# Patient Record
Sex: Male | Born: 1955 | Race: White | Hispanic: No | Marital: Married | Smoking: Current every day smoker
Health system: Southern US, Community
[De-identification: ages and names within clinical notes are randomized; demographics above are authoritative.]

## PROBLEM LIST (undated history)

## (undated) DIAGNOSIS — E785 Hyperlipidemia, unspecified: Secondary | ICD-10-CM

## (undated) DIAGNOSIS — J449 Chronic obstructive pulmonary disease, unspecified: Secondary | ICD-10-CM

## (undated) DIAGNOSIS — J939 Pneumothorax, unspecified: Secondary | ICD-10-CM

## (undated) HISTORY — PX: CHEST TUBE INSERTION: SHX231

## (undated) HISTORY — DX: Chronic obstructive pulmonary disease, unspecified: J44.9

## (undated) HISTORY — DX: Hyperlipidemia, unspecified: E78.5

---

## 2004-06-17 ENCOUNTER — Inpatient Hospital Stay: Payer: Self-pay | Admitting: General Surgery

## 2006-06-22 ENCOUNTER — Emergency Department: Payer: Self-pay | Admitting: Emergency Medicine

## 2006-06-24 ENCOUNTER — Encounter: Payer: Self-pay | Admitting: Internal Medicine

## 2006-07-07 ENCOUNTER — Encounter: Payer: Self-pay | Admitting: Internal Medicine

## 2017-05-23 ENCOUNTER — Other Ambulatory Visit: Payer: Self-pay

## 2017-05-23 ENCOUNTER — Emergency Department: Payer: BLUE CROSS/BLUE SHIELD

## 2017-05-23 ENCOUNTER — Inpatient Hospital Stay
Admission: EM | Admit: 2017-05-23 | Discharge: 2017-05-26 | DRG: 190 | Disposition: A | Discharge status: Home / Self Care | Payer: BLUE CROSS/BLUE SHIELD | Attending: Internal Medicine | Admitting: Internal Medicine

## 2017-05-23 DIAGNOSIS — J40 Bronchitis, not specified as acute or chronic: Secondary | ICD-10-CM

## 2017-05-23 DIAGNOSIS — R0602 Shortness of breath: Secondary | ICD-10-CM

## 2017-05-23 DIAGNOSIS — J449 Chronic obstructive pulmonary disease, unspecified: Secondary | ICD-10-CM

## 2017-05-23 DIAGNOSIS — J9601 Acute respiratory failure with hypoxia: Secondary | ICD-10-CM | POA: Diagnosis present

## 2017-05-23 DIAGNOSIS — J441 Chronic obstructive pulmonary disease with (acute) exacerbation: Secondary | ICD-10-CM | POA: Diagnosis not present

## 2017-05-23 DIAGNOSIS — R0902 Hypoxemia: Secondary | ICD-10-CM

## 2017-05-23 DIAGNOSIS — F17210 Nicotine dependence, cigarettes, uncomplicated: Secondary | ICD-10-CM | POA: Diagnosis present

## 2017-05-23 DIAGNOSIS — J9691 Respiratory failure, unspecified with hypoxia: Secondary | ICD-10-CM | POA: Diagnosis present

## 2017-05-23 HISTORY — DX: Pneumothorax, unspecified: J93.9

## 2017-05-23 LAB — BASIC METABOLIC PANEL
Anion gap: 10 (ref 5–15)
BUN: 11 mg/dL (ref 6–20)
CHLORIDE: 96 mmol/L — AB (ref 101–111)
CO2: 29 mmol/L (ref 22–32)
Calcium: 9.1 mg/dL (ref 8.9–10.3)
Creatinine, Ser: 0.63 mg/dL (ref 0.61–1.24)
Glucose, Bld: 100 mg/dL — ABNORMAL HIGH (ref 65–99)
POTASSIUM: 4.4 mmol/L (ref 3.5–5.1)
SODIUM: 135 mmol/L (ref 135–145)

## 2017-05-23 LAB — CBC WITH DIFFERENTIAL/PLATELET
Basophils Absolute: 0 10*3/uL (ref 0–0.1)
Basophils Relative: 0 %
Eosinophils Absolute: 0 10*3/uL (ref 0–0.7)
Eosinophils Relative: 0 %
HCT: 39 % — ABNORMAL LOW (ref 40.0–52.0)
HEMOGLOBIN: 13.3 g/dL (ref 13.0–18.0)
LYMPHS ABS: 1.5 10*3/uL (ref 1.0–3.6)
LYMPHS PCT: 13 %
MCH: 32.7 pg (ref 26.0–34.0)
MCHC: 34.1 g/dL (ref 32.0–36.0)
MCV: 95.9 fL (ref 80.0–100.0)
Monocytes Absolute: 1.3 10*3/uL — ABNORMAL HIGH (ref 0.2–1.0)
Monocytes Relative: 11 %
NEUTROS ABS: 8.6 10*3/uL — AB (ref 1.4–6.5)
NEUTROS PCT: 76 %
PLATELETS: 315 10*3/uL (ref 150–440)
RBC: 4.07 MIL/uL — ABNORMAL LOW (ref 4.40–5.90)
RDW: 13.3 % (ref 11.5–14.5)
WBC: 11.3 10*3/uL — AB (ref 3.8–10.6)

## 2017-05-23 MED ORDER — METHYLPREDNISOLONE SODIUM SUCC 125 MG IJ SOLR
INTRAMUSCULAR | Status: AC
  Filled 2017-05-23: qty 2

## 2017-05-23 MED ORDER — IPRATROPIUM-ALBUTEROL 0.5-2.5 (3) MG/3ML IN SOLN
3.0000 mL | Freq: Once | RESPIRATORY_TRACT | Status: AC
  Administered 2017-05-23: 3 mL via RESPIRATORY_TRACT

## 2017-05-23 MED ORDER — IPRATROPIUM-ALBUTEROL 0.5-2.5 (3) MG/3ML IN SOLN
RESPIRATORY_TRACT | Status: AC
  Filled 2017-05-23: qty 9

## 2017-05-23 MED ORDER — METHYLPREDNISOLONE SODIUM SUCC 125 MG IJ SOLR
125.0000 mg | Freq: Once | INTRAMUSCULAR | Status: AC
  Administered 2017-05-23: 125 mg via INTRAVENOUS

## 2017-05-23 MED ORDER — SODIUM CHLORIDE 0.9 % IV SOLN
500.0000 mg | Freq: Once | INTRAVENOUS | Status: AC
  Administered 2017-05-24: 500 mg via INTRAVENOUS
  Filled 2017-05-23: qty 500

## 2017-05-23 MED ORDER — MAGNESIUM SULFATE 2 GM/50ML IV SOLN
2.0000 g | Freq: Once | INTRAVENOUS | Status: AC
  Administered 2017-05-23: 2 g via INTRAVENOUS
  Filled 2017-05-23: qty 50

## 2017-05-23 MED ORDER — IPRATROPIUM-ALBUTEROL 0.5-2.5 (3) MG/3ML IN SOLN
3.0000 mL | Freq: Once | RESPIRATORY_TRACT | Status: AC
  Administered 2017-05-23: 3 mL via RESPIRATORY_TRACT
  Filled 2017-05-23: qty 6

## 2017-05-23 NOTE — ED Notes (Signed)
Patient transported to X-ray 

## 2017-05-23 NOTE — ED Notes (Signed)
Pt assisted to wheelchair upon arrival; talking in short sentences; pt says he was seen this week and diagnosed with bronchitis; taking medications as prescribed but not feeling any better; noted cough; sats 88% on room air upon arrival

## 2017-05-23 NOTE — ED Notes (Signed)
Pt with improvement in wheezing.

## 2017-05-23 NOTE — ED Notes (Signed)
This tech in to triage room to obtain lab draw and noticed that pt was working a little harder then normal to breath with slight chest retraction (o2 sats at 89%), tech called to [employer] for verbal order of 2L of o2. Pt placed on 2L [place] and o2 saturation up to 96%, pt appears more comfortable at this time and first nurse RN notified.

## 2017-05-23 NOTE — ED Triage Notes (Signed)
Patient reports diagnosed last week with bronchitis.  Patient reports continued shortness of breath.  Patient with congested cough.  Hx of smoking.

## 2017-05-24 ENCOUNTER — Inpatient Hospital Stay: Payer: BLUE CROSS/BLUE SHIELD

## 2017-05-24 ENCOUNTER — Encounter: Payer: Self-pay | Admitting: Emergency Medicine

## 2017-05-24 DIAGNOSIS — J441 Chronic obstructive pulmonary disease with (acute) exacerbation: Secondary | ICD-10-CM | POA: Diagnosis present

## 2017-05-24 DIAGNOSIS — R0602 Shortness of breath: Secondary | ICD-10-CM | POA: Diagnosis present

## 2017-05-24 DIAGNOSIS — J9601 Acute respiratory failure with hypoxia: Secondary | ICD-10-CM | POA: Diagnosis present

## 2017-05-24 DIAGNOSIS — F17210 Nicotine dependence, cigarettes, uncomplicated: Secondary | ICD-10-CM | POA: Diagnosis present

## 2017-05-24 DIAGNOSIS — J9691 Respiratory failure, unspecified with hypoxia: Secondary | ICD-10-CM | POA: Diagnosis present

## 2017-05-24 LAB — BLOOD GAS, VENOUS
ACID-BASE EXCESS: 5.6 mmol/L — AB (ref 0.0–2.0)
Bicarbonate: 31.1 mmol/L — ABNORMAL HIGH (ref 20.0–28.0)
O2 Saturation: 65.1 %
PATIENT TEMPERATURE: 37
PCO2 VEN: 48 mmHg (ref 44.0–60.0)
PH VEN: 7.42 (ref 7.250–7.430)
pO2, Ven: 33 mmHg (ref 32.0–45.0)

## 2017-05-24 LAB — TSH: TSH: 0.709 u[IU]/mL (ref 0.350–4.500)

## 2017-05-24 LAB — TROPONIN I: Troponin I: <0.03 ng/mL (ref <0.03)

## 2017-05-24 MED ORDER — DOCUSATE SODIUM 100 MG PO CAPS
100.0000 mg | ORAL_CAPSULE | Freq: Two times a day (BID) | ORAL | Status: DC
  Administered 2017-05-24 – 2017-05-25 (×4): 100 mg via ORAL
  Filled 2017-05-24 (×5): qty 1

## 2017-05-24 MED ORDER — ENOXAPARIN SODIUM 40 MG/0.4ML ~~LOC~~ SOLN
40.0000 mg | SUBCUTANEOUS | Status: DC
  Administered 2017-05-24 – 2017-05-25 (×2): 40 mg via SUBCUTANEOUS
  Filled 2017-05-24 (×2): qty 0.4

## 2017-05-24 MED ORDER — PREDNISONE 10 MG PO TABS: 5.0000 mg | ORAL_TABLET | Freq: Every day | ORAL | Status: DC

## 2017-05-24 MED ORDER — ACETAMINOPHEN 650 MG RE SUPP: 650.0000 mg | Freq: Four times a day (QID) | RECTAL | Status: DC | PRN

## 2017-05-24 MED ORDER — NICOTINE 21 MG/24HR TD PT24
21.0000 mg | MEDICATED_PATCH | Freq: Every day | TRANSDERMAL | Status: DC
  Administered 2017-05-24 – 2017-05-26 (×3): 21 mg via TRANSDERMAL
  Filled 2017-05-24 (×3): qty 1

## 2017-05-24 MED ORDER — TIOTROPIUM BROMIDE MONOHYDRATE 18 MCG IN CAPS
18.0000 ug | ORAL_CAPSULE | Freq: Every day | RESPIRATORY_TRACT | Status: DC
  Administered 2017-05-24 – 2017-05-26 (×3): 18 ug via RESPIRATORY_TRACT
  Filled 2017-05-24: qty 5

## 2017-05-24 MED ORDER — PREDNISONE 20 MG PO TABS
40.0000 mg | ORAL_TABLET | Freq: Every day | ORAL | Status: AC
  Administered 2017-05-25: 40 mg via ORAL
  Filled 2017-05-24: qty 2

## 2017-05-24 MED ORDER — ONDANSETRON HCL 4 MG PO TABS: 4.0000 mg | ORAL_TABLET | Freq: Four times a day (QID) | ORAL | Status: DC | PRN

## 2017-05-24 MED ORDER — PREDNISONE 50 MG PO TABS
50.0000 mg | ORAL_TABLET | Freq: Every day | ORAL | Status: AC
Start: 2017-05-24 — End: 2017-05-24
  Administered 2017-05-24: 50 mg via ORAL
  Filled 2017-05-24: qty 1

## 2017-05-24 MED ORDER — ONDANSETRON HCL 4 MG/2ML IJ SOLN: 4.0000 mg | Freq: Four times a day (QID) | INTRAMUSCULAR | Status: DC | PRN

## 2017-05-24 MED ORDER — PREDNISONE 20 MG PO TABS: 20.0000 mg | ORAL_TABLET | Freq: Every day | ORAL | Status: DC

## 2017-05-24 MED ORDER — ACETAMINOPHEN 325 MG PO TABS
650.0000 mg | ORAL_TABLET | Freq: Four times a day (QID) | ORAL | Status: DC | PRN
  Administered 2017-05-24: 650 mg via ORAL
  Filled 2017-05-24: qty 2

## 2017-05-24 MED ORDER — PREDNISONE 20 MG PO TABS
30.0000 mg | ORAL_TABLET | Freq: Every day | ORAL | Status: AC
  Administered 2017-05-26: 30 mg via ORAL
  Filled 2017-05-24: qty 1

## 2017-05-24 MED ORDER — PREDNISONE 10 MG PO TABS: 10.0000 mg | ORAL_TABLET | Freq: Every day | ORAL | Status: DC

## 2017-05-24 MED ORDER — AZITHROMYCIN 250 MG PO TABS
250.0000 mg | ORAL_TABLET | Freq: Every day | ORAL | Status: DC
  Administered 2017-05-24 – 2017-05-26 (×3): 250 mg via ORAL
  Filled 2017-05-24 (×3): qty 1

## 2017-05-24 NOTE — Progress Notes (Signed)
Advanced care plan.  Purpose of the Encounter: CODE STATUS  Parties in Attendance:Patient  Patient's Decision Capacity:Good Subjective/Patient's story: Presented for shortness of breath and wheezing Objective/Medical story Has copd and hypoxia Needs nebs treatments and oxygen Goals of care determination:  Advance directives discussed with the patient For now patient wants everything done which includes cardiac resuscitation, intubation and ventilator if the need arises CODE STATUS: Full code Time spent discussing advanced care planning: 16 minutes

## 2017-05-24 NOTE — Progress Notes (Signed)
[facility] [place] at [address]   PATIENT NAME: Chase Burnett    MR#:  [id number]  DATE OF BIRTH:  1955-09-10  SUBJECTIVE:  CHIEF COMPLAINT:   Chief Complaint  Patient presents with  . Shortness of Breath  Patient seen and evaluated today On oxygen via nasal cannula Has wheezing and difficulty breathing Gets short of breath and short winded  REVIEW OF SYSTEMS:    ROS  CONSTITUTIONAL: No documented fever. No fatigue, weakness. No weight gain, no weight loss.  EYES: No blurry or double vision.  ENT: No tinnitus. No postnasal drip. No redness of the oropharynx.  RESPIRATORY: Has cough, Has wheeze, no hemoptysis. Has dyspnea.  CARDIOVASCULAR: No chest pain. No orthopnea. No palpitations. No syncope.  GASTROINTESTINAL: No nausea, no vomiting or diarrhea. No abdominal pain. No melena or hematochezia.  GENITOURINARY: No dysuria or hematuria.  ENDOCRINE: No polyuria or nocturia. No heat or cold intolerance.  HEMATOLOGY: No anemia. No bruising. No bleeding.  INTEGUMENTARY: No rashes. No lesions.  MUSCULOSKELETAL: No arthritis. No swelling. No gout.  NEUROLOGIC: No numbness, tingling, or ataxia. No seizure-type activity.  PSYCHIATRIC: No anxiety. No insomnia. No ADD.   DRUG ALLERGIES:  No Known Allergies  VITALS:  Blood pressure 136/77, pulse 94, temperature 97.8 F (36.6 C), temperature source Oral, resp. rate 20, height  (1.727 m), weight 63.6 kg (140 lb 4.8 oz), SpO2 98 %.  PHYSICAL EXAMINATION:   Physical Exam  GENERAL:  62 y.o.-year-old patient lying in the bed with no acute distress.  EYES: Pupils equal, round, reactive to light and accommodation. No scleral icterus. Extraocular muscles intact.  HEENT: Head atraumatic, normocephalic. Oropharynx and nasopharynx clear.  NECK:  Supple, no jugular venous distention. No thyroid enlargement, no tenderness.  LUNGS: Decreased breath sounds bilaterally, bilateral wheezing,. No use of accessory muscles  of respiration.  CARDIOVASCULAR: S1, S2 normal. No murmurs, rubs, or gallops.  ABDOMEN: Soft, nontender, nondistended. Bowel sounds present. No organomegaly or mass.  EXTREMITIES: No cyanosis, clubbing or edema b/l.    NEUROLOGIC: Cranial nerves II through XII are intact. No focal Motor or sensory deficits b/l.   PSYCHIATRIC: The patient is alert and oriented x 3.  SKIN: No obvious rash, lesion, or ulcer.   LABORATORY PANEL:   CBC Recent Labs  Lab 05/23/17 2156  WBC 11.3*  HGB 13.3  HCT 39.0*  PLT 315   ------------------------------------------------------------------------------------------------------------------ Chemistries  Recent Labs  Lab 05/23/17 2156  NA 135  K 4.4  CL 96*  CO2 29  GLUCOSE 100*  BUN 11  CREATININE 0.63  CALCIUM 9.1   ------------------------------------------------------------------------------------------------------------------  Cardiac Enzymes Recent Labs  Lab 05/23/17 2156  TROPONINI <0.03   ------------------------------------------------------------------------------------------------------------------  RADIOLOGY:  Dg Chest 2 View  Result Date: 05/23/2017 CLINICAL DATA:  Shortness of Breath EXAM: CHEST - 2 VIEW COMPARISON:  07/01/2004 FINDINGS: Coarsened interstitial opacities throughout the lungs, likely severe chronic interstitial lung disease. Hyperinflation/COPD. Nodular areas in both mid lungs may represent a component of the interstitial disease but I cannot exclude other pulmonary nodules. No effusions. Heart is normal size. IMPRESSION: Chronic interstitial lung disease, COPD. Nodular densities in both mid lungs. These could be further evaluated with noncontrast chest CT. Electronically Signed   By: Charlett Nose M.D.   On: 05/23/2017 22:26     ASSESSMENT AND PLAN:   62 year old male patient with history of tobacco abuse presented to the hospital with shortness of breath, wheezing and low oxygen saturation.  -Acute hypoxic  respiratory distress  secondary to COPD Continue oxygen via nasal cannula  -New onset COPD with exacerbation IV steroids Nebulization treatments Oral Zithromax antibiotics  -Tobacco abuse Tobacco cessation counseled to the patient for 6 minutes Nicotine patch offered  -DVT prophylaxis subcu Lovenox daily  All the records are reviewed and case discussed with Care Management/Social Worker. Management plans discussed with the patient, family and they are in agreement.  CODE STATUS: Full code  DVT Prophylaxis: SCDs  TOTAL TIME TAKING CARE OF THIS PATIENT: 35 minutes.   POSSIBLE D/C IN 2 to3  DAYS, DEPENDING ON CLINICAL CONDITION.  Ihor Austin M.D on 05/24/2017 at 10:28 AM  Between 7am to 6pm - Pager - [phone]  After 6pm go to [email or web address] - password [email or web address]  [facility]  Office  [phone]  CC: Primary care physician; Patient, No Pcp Per  Note: This dictation was prepared with Dragon dictation along with smaller phrase technology. Any transcriptional errors that result from this process are unintentional.

## 2017-05-24 NOTE — H&P (Signed)
Chase Burnett is an 62 y.o. male.   Chief Complaint: Shortness of breath HPI: The patient with no chronic medical problems presents emergency department with shortness of breath.  The patient states that he has had cough and worsening shortness of breath over the last few days.  In the emergency department oxygen saturations were 88% on arrival.  He denies shortness of breath, nausea, vomiting or diaphoresis.  The patient received Solu-Medrol and multiple breathing treatments.  Oxygen saturations improved with supplemental oxygen via nasal cannula.  Due to his increased work of breathing and hypoxia the emergency department staff called the hospitalist service for admission.  Past Medical History:  Diagnosis Date  . Pneumothorax     Past Surgical History:  Procedure Laterality Date  . CHEST TUBE INSERTION      History reviewed. No pertinent family history. none Social History:  reports that he has been smoking cigarettes.  He has a 40.00 pack-year smoking history. He has never used smokeless tobacco. He reports that he drinks alcohol. His drug history is not on file.  Allergies: No Known Allergies  No medications prior to admission.    Results for orders placed or performed during the hospital encounter of 05/23/17 (from the past 48 hour(s))  CBC with Differential     Status: Abnormal   Collection Time: 05/23/17  9:56 PM  Result Value Ref Range   WBC 11.3 (H) 3.8 - 10.6 K/uL   RBC 4.07 (L) 4.40 - 5.90 MIL/uL   Hemoglobin 13.3 13.0 - 18.0 g/dL   HCT 39.0 (L) 40.0 - 52.0 %   MCV 95.9 80.0 - 100.0 fL   MCH 32.7 26.0 - 34.0 pg   MCHC 34.1 32.0 - 36.0 g/dL   RDW 13.3 11.5 - 14.5 %   Platelets 315 150 - 440 K/uL   Neutrophils Relative % 76 %   Neutro Abs 8.6 (H) 1.4 - 6.5 K/uL   Lymphocytes Relative 13 %   Lymphs Abs 1.5 1.0 - 3.6 K/uL   Monocytes Relative 11 %   Monocytes Absolute 1.3 (H) 0.2 - 1.0 K/uL   Eosinophils Relative 0 %   Eosinophils Absolute 0.0 0 - 0.7 K/uL   Basophils Relative 0 %   Basophils Absolute 0.0 0 - 0.1 K/uL    Comment: Performed at [facility], [address] [place], [place] [address]  Basic metabolic panel     Status: Abnormal   Collection Time: 05/23/17  9:56 PM  Result Value Ref Range   Sodium 135 135 - 145 mmol/L   Potassium 4.4 3.5 - 5.1 mmol/L   Chloride 96 (L) 101 - 111 mmol/L   CO2 29 22 - 32 mmol/L   Glucose, Bld 100 (H) 65 - 99 mg/dL   BUN 11 6 - 20 mg/dL   Creatinine, Ser 0.63 0.61 - 1.24 mg/dL   Calcium 9.1 8.9 - 10.3 mg/dL   GFR calc non Af Amer >60 >60 mL/min   GFR calc Af Amer >60 >60 mL/min    Comment: (NOTE) The eGFR has been calculated using the CKD EPI equation. This calculation has not been validated in all clinical situations. eGFR's persistently <60 mL/min signify possible Chronic Kidney Disease.    Anion gap 10 5 - 15    Comment: Performed at [facility], [address] [place], [place] [address]  Troponin I     Status: None   Collection Time: 05/23/17  9:56 PM  Result Value Ref Range  Troponin I <0.03 <0.03 ng/mL    Comment: Performed at [facility], [address] [place], [place] [address]  TSH     Status: None   Collection Time: 05/23/17  9:56 PM  Result Value Ref Range   TSH 0.709 0.350 - 4.500 uIU/mL    Comment: Performed by a 3rd Generation assay with a functional sensitivity of <=0.01 uIU/mL. Performed at [facility], [address] [place], [place] [address]   Blood gas, venous     Status: Abnormal   Collection Time: 05/23/17 11:53 PM  Result Value Ref Range   pH, Ven 7.42 7.250 - 7.430   pCO2, Ven 48 44.0 - 60.0 mmHg   pO2, Ven 33.0 32.0 - 45.0 mmHg   Bicarbonate 31.1 (H) 20.0 - 28.0 mmol/L   Acid-Base Excess 5.6 (H) 0.0 - 2.0 mmol/L   O2 Saturation 65.1 %   Patient temperature 37.0    Collection site LINE    Sample type VENOUS     Comment: Performed at [facility], [address] [place],  [address]   Dg  Chest 2 View  Result Date: 05/23/2017 CLINICAL DATA:  Shortness of Breath EXAM: CHEST - 2 VIEW COMPARISON:  07/01/2004 FINDINGS: Coarsened interstitial opacities throughout the lungs, likely severe chronic interstitial lung disease. Hyperinflation/COPD. Nodular areas in both mid lungs may represent a component of the interstitial disease but I cannot exclude other pulmonary nodules. No effusions. Heart is normal size. IMPRESSION: Chronic interstitial lung disease, COPD. Nodular densities in both mid lungs. These could be further evaluated with noncontrast chest CT. Electronically Signed   By: Rolm Baptise M.D.   On: 05/23/2017 22:26    Review of Systems  Constitutional: Negative for chills and fever.  HENT: Negative for sore throat and tinnitus.   Eyes: Negative for blurred vision and redness.  Respiratory: Positive for cough and shortness of breath.   Cardiovascular: Negative for chest pain, palpitations, orthopnea and PND.  Gastrointestinal: Negative for abdominal pain, diarrhea, nausea and vomiting.  Genitourinary: Negative for dysuria, frequency and urgency.  Musculoskeletal: Negative for joint pain and myalgias.  Skin: Negative for rash.       No lesions  Neurological: Negative for speech change, focal weakness and weakness.  Endo/Heme/Allergies: Does not bruise/bleed easily.       No temperature intolerance  Psychiatric/Behavioral: Negative for depression and suicidal ideas.    Blood pressure (!) 133/57, pulse 93, temperature 98.2 F (36.8 C), temperature source Oral, resp. rate 19, height _0  (1.727 m), weight 63.6 kg (140 lb 4.8 oz), SpO2 95 %. Physical Exam  Vitals reviewed. Constitutional: He is oriented to person, place, and time. He appears well-developed and well-nourished. No distress. Nasal cannula in place.  HENT:  Head: Normocephalic and atraumatic.  Mouth/Throat: Oropharynx is clear and moist.  Eyes: Pupils are equal, round, and reactive to light. Conjunctivae and  EOM are normal. No scleral icterus.  Neck: Normal range of motion. Neck supple. No JVD present. No tracheal deviation present. No thyromegaly present.  Cardiovascular: Normal rate, regular rhythm and normal heart sounds. Exam reveals no gallop and no friction rub.  No murmur heard. Respiratory: Effort normal and breath sounds normal. No respiratory distress.  GI: Soft. Bowel sounds are normal. He exhibits no distension. There is no tenderness.  Genitourinary:  Genitourinary Comments: Deferred  Musculoskeletal: Normal range of motion. He exhibits no edema.  Lymphadenopathy:    He has no cervical adenopathy.  Neurological: He is alert and  oriented to person, place, and time. No cranial nerve deficit.  Skin: Skin is warm and dry. No rash noted. No erythema.  Psychiatric: He has a normal mood and affect. His behavior is normal. Judgment and thought content normal.     Assessment/Plan This is a 62 year old male admitted for respiratory failure with hypoxia. 1.  Respiratory failure: Acute; with hypoxia.  Appears to be COPD exacerbation although the patient does not carry this diagnosis.  He is a chronic smoker.  He has received azithromycin for anti-inflammatory effect which we will continue along with a steroid taper.  Albuterol as needed. 2.  COPD: New diagnosis; I have started Spiriva. 3.  Tobacco abuse: 40-year pack history.  NicoDerm patch while hospitalized.  Counseled on smoking cessation. 4.  DVT prophylaxis: Lovenox 5.  GI prophylaxis: None The patient is a full code.  Time spent on admission orders and patient care approximately 45 minutes  Harrie Foreman, MD 05/24/2017, 7:15 AM

## 2017-05-24 NOTE — ED Provider Notes (Signed)
[facility] Emergency Department Provider Note   ____________________________________________   First MD Initiated Contact with Patient 05/23/17 2336     (approximate)  I have reviewed the triage vital signs and the nursing notes.   HISTORY  Chief Complaint Shortness of Breath    HPI Chase Burnett is a 62 y.o. male who comes into the hospital today with some shortness of breath.  The patient states that he has bronchitis.  The [facility] and states that he was given antibiotics, prednisone and an inhaler.  He reports that the medications have not been helping and he is continued feeling short of breath.  He states that he has had these symptoms on and off for a while and he is never gotten rid of it.  The patient does not wear oxygen at home but he is a smoker.  The patient states that he felt warm on Thursday so assumed he had a fever.  He had some chills headache and sore throat.  His cough is occasionally productive of yellow appearing sputum but he states that when he coughs he feels like he cannot breathe.  The patient is here today for evaluation of his symptoms.  History reviewed. No pertinent past medical history.  Patient Active Problem List   Diagnosis Date Noted  . Respiratory failure with hypoxia (HCC) 05/24/2017    History reviewed. No pertinent surgical history.  Prior to Admission medications   Not on File    Allergies Patient has no known allergies.  No family history on file.  Social History Social History   Tobacco Use  . Smoking status: Current Every Day Smoker  . Smokeless tobacco: Never Used  Substance Use Topics  . Alcohol use: Yes  . Drug use: Not on file    Review of Systems  Constitutional:  fever/chills Eyes: No visual changes. ENT:  sore throat. Cardiovascular: Denies chest pain. Respiratory: Cough and shortness of breath. Gastrointestinal: No abdominal pain.  No  nausea, no vomiting.  Genitourinary: Negative for dysuria. Musculoskeletal: Negative for back pain. Skin: Negative for rash. Neurological: Headache   ____________________________________________   PHYSICAL EXAM:  VITAL SIGNS: ED Triage Vitals  Enc Vitals Group     BP 05/23/17 2153 (!) 159/82     Pulse Rate 05/23/17 2153 (!) 125     Resp 05/23/17 2153 (!) 26     Temp 05/23/17 2153 98.8 F (37.1 C)     Temp Source 05/23/17 2153 Oral     SpO2 05/23/17 2153 93 %     Weight 05/23/17 2150 142 lb (64.4 kg)     Height 05/23/17 2150  (1.727 m)     Head Circumference --      Peak Flow --      Pain Score 05/23/17 2150 0     Pain Loc --      Pain Edu? --      Excl. in GC? --     Constitutional: Alert and oriented. Well appearing and in moderate distress. Eyes: Conjunctivae are normal. PERRL. EOMI. Head: Atraumatic. Nose: No congestion/rhinnorhea. Mouth/Throat: Mucous membranes are moist.  Oropharynx non-erythematous. Cardiovascular: Tachycardia, regular rhythm. Grossly normal heart sounds.  Good peripheral circulation. Respiratory: Increased respiratory effort.  No retractions.  Expiratory wheezes in all lung fields Gastrointestinal: Soft and nontender. No distention.  Positive bowel sounds Musculoskeletal: No lower extremity tenderness nor edema.   Neurologic:  Normal speech and language.  Skin:  Skin is warm, dry and intact.  Psychiatric: Mood and affect are normal.   ____________________________________________   LABS (all labs ordered are listed, but only abnormal results are displayed)  Labs Reviewed  CBC WITH DIFFERENTIAL/PLATELET - Abnormal; Notable for the following components:      Result Value   WBC 11.3 (*)    RBC 4.07 (*)    HCT 39.0 (*)    Neutro Abs 8.6 (*)    Monocytes Absolute 1.3 (*)    All other components within normal limits  BASIC METABOLIC PANEL - Abnormal; Notable for the following components:   Chloride 96 (*)    Glucose, Bld 100 (*)     All other components within normal limits  BLOOD GAS, VENOUS - Abnormal; Notable for the following components:   Bicarbonate 31.1 (*)    Acid-Base Excess 5.6 (*)    All other components within normal limits  TROPONIN I   ____________________________________________  EKG  ED ECG REPORT I, Rebecka Apley, the attending physician, personally viewed and interpreted this ECG.   Date: 05/23/2017  EKG Time: 2153  Rate: 125  Rhythm: sinus tachycardia  Axis: normal  Intervals:none  ST&T Change: none  ____________________________________________  RADIOLOGY  ED MD interpretation: Chest x-ray: Chronic interstitial lung disease, COPD, nodular densities in both mid lungs.  Official radiology report(s): Dg Chest 2 View  Result Date: 05/23/2017 CLINICAL DATA:  Shortness of Breath EXAM: CHEST - 2 VIEW COMPARISON:  07/01/2004 FINDINGS: Coarsened interstitial opacities throughout the lungs, likely severe chronic interstitial lung disease. Hyperinflation/COPD. Nodular areas in both mid lungs may represent a component of the interstitial disease but I cannot exclude other pulmonary nodules. No effusions. Heart is normal size. IMPRESSION: Chronic interstitial lung disease, COPD. Nodular densities in both mid lungs. These could be further evaluated with noncontrast chest CT. Electronically Signed   By: Charlett Nose M.D.   On: 05/23/2017 22:26    ____________________________________________   PROCEDURES  Procedure(s) performed: None  Procedures  Critical Care performed: No  ____________________________________________   INITIAL IMPRESSION / ASSESSMENT AND PLAN / ED COURSE  As part of my medical decision making, I reviewed the following data within the electronic MEDICAL RECORD NUMBER Notes from prior ED visits and  Controlled Substance Database   This is a 62 year old male who comes into the hospital today with some shortness of breath.  The patient is a smoker but does not go to the  doctor so he does not have a formal diagnosis of COPD.  My differential diagnosis includes pneumonia, bronchitis, COPD  We did check some blood work to include a CBC, BMP, blood gas and a troponin.  The patient's CBC shows a white count of 11.3 but is otherwise unremarkable.  The patient's chest x-ray shows some chronic interstitial lung disease with no pneumonia.  The patient did receive 3 DuoNeb's and Solu-Medrol.  I then gave the patient some magnesium sulfate and a liter of normal saline.  Since he was hypoxic when he initially arrived 88% he will be admitted to the hospitalist service for further treatment and evaluation.      ____________________________________________   FINAL CLINICAL IMPRESSION(S) / ED DIAGNOSES  Final diagnoses:  Hypoxia  Bronchitis  Shortness of breath  Chronic obstructive pulmonary disease, unspecified COPD type [facility])     ED Discharge Orders    None       Note:  This document was prepared using Dragon voice recognition software and may include unintentional dictation errors.  Rebecka Apley, MD 05/24/17 [phone]

## 2017-05-24 NOTE — ED Notes (Signed)
Bed adjusted for comfort. Pt with resolution of wheezing. Pt is able to speak in full sentences. Pt states feels "a lot better".

## 2017-05-25 MED ORDER — IPRATROPIUM-ALBUTEROL 0.5-2.5 (3) MG/3ML IN SOLN
3.0000 mL | RESPIRATORY_TRACT | Status: DC | PRN
  Administered 2017-05-25 – 2017-05-26 (×2): 3 mL via RESPIRATORY_TRACT
  Filled 2017-05-25: qty 3

## 2017-05-25 NOTE — Progress Notes (Signed)
SATURATION QUALIFICATIONS: (This note is used to comply with regulatory documentation for home oxygen)  Patient Saturations on Room Air at Rest = 95%  Patient Saturations on Room Air while Ambulating = 84%  Patient Saturations on 3 Liters of oxygen while Ambulating = 91%  Please briefly explain why patient needs home oxygen: dyspnea on exertion

## 2017-05-25 NOTE — Progress Notes (Signed)
[facility] [place] NAME: Chase Burnett    MR#:  [id number]  DATE OF BIRTH:  [birth year]  SUBJECTIVE:  CHIEF COMPLAINT:   Chief Complaint  Patient presents with  . Shortness of Breath  Patient seen and evaluated today Comfortable while sitting on the bed Oxygen saturation drops on ambulation Dropped to 84% and patient had to be put on oxygen via nasal cannula at 3 L Still has wheezing Gets short of breath and short winded CT chest reviewed shows reticulo-nodular pattern with atypical infection, COPD changes  REVIEW OF SYSTEMS:    ROS  CONSTITUTIONAL: No documented fever. No fatigue, weakness. No weight gain, no weight loss.  EYES: No blurry or double vision.  ENT: No tinnitus. No postnasal drip. No redness of the oropharynx.  RESPIRATORY: Has occasional cough, Has wheezing, no hemoptysis. Has dyspnea.  CARDIOVASCULAR: No chest pain. No orthopnea. No palpitations. No syncope.  GASTROINTESTINAL: No nausea, no vomiting or diarrhea. No abdominal pain. No melena or hematochezia.  GENITOURINARY: No dysuria or hematuria.  ENDOCRINE: No polyuria or nocturia. No heat or cold intolerance.  HEMATOLOGY: No anemia. No bruising. No bleeding.  INTEGUMENTARY: No rashes. No lesions.  MUSCULOSKELETAL: No arthritis. No swelling. No gout.  NEUROLOGIC: No numbness, tingling, or ataxia. No seizure-type activity.  PSYCHIATRIC: No anxiety. No insomnia. No ADD.   DRUG ALLERGIES:  No Known Allergies  VITALS:  Blood pressure 137/80, pulse (!) 49, temperature 98 F (36.7 C), temperature source Oral, resp. rate (!) 24, height 5' 8"  (1.727 m), weight 65.5 kg (144 lb 4.8 oz), SpO2 99 %.  PHYSICAL EXAMINATION:   Physical Exam  GENERAL:  62 y.o.-year-old patient lying in the bed with no acute distress.  EYES: Pupils equal, round, reactive to light and accommodation. No scleral icterus. Extraocular muscles intact.  HEENT: Head atraumatic, normocephalic.  Oropharynx and nasopharynx clear.  NECK:  Supple, no jugular venous distention. No thyroid enlargement, no tenderness.  LUNGS: Decreased breath sounds bilaterally, bilateral wheezing,. No use of accessory muscles of respiration.  CARDIOVASCULAR: S1, S2 normal. No murmurs, rubs, or gallops.  ABDOMEN: Soft, nontender, nondistended. Bowel sounds present. No organomegaly or mass.  EXTREMITIES: No cyanosis, clubbing or edema b/l.    NEUROLOGIC: Cranial nerves II through XII are intact. No focal Motor or sensory deficits b/l.   PSYCHIATRIC: The patient is alert and oriented x 3.  SKIN: No obvious rash, lesion, or ulcer.   LABORATORY PANEL:   CBC Recent Labs  Lab 05/23/17 2156  WBC 11.3*  HGB 13.3  HCT 39.0*  PLT 315   ------------------------------------------------------------------------------------------------------------------ Chemistries  Recent Labs  Lab 05/23/17 2156  NA 135  K 4.4  CL 96*  CO2 29  GLUCOSE 100*  BUN 11  CREATININE 0.63  CALCIUM 9.1   ------------------------------------------------------------------------------------------------------------------  Cardiac Enzymes Recent Labs  Lab 05/23/17 2156  TROPONINI <0.03   ------------------------------------------------------------------------------------------------------------------  RADIOLOGY:  Dg Chest 2 View  Result Date: 05/23/2017 CLINICAL DATA:  Shortness of Breath EXAM: CHEST - 2 VIEW COMPARISON:  07/01/2004 FINDINGS: Coarsened interstitial opacities throughout the lungs, likely severe chronic interstitial lung disease. Hyperinflation/COPD. Nodular areas in both mid lungs may represent a component of the interstitial disease but I cannot exclude other pulmonary nodules. No effusions. Heart is normal size. IMPRESSION: Chronic interstitial lung disease, COPD. Nodular densities in both mid lungs. These could be further evaluated with noncontrast chest CT. Electronically Signed   By: Rolm Baptise M.D.    On: 05/23/2017 22:26  Ct Chest Wo Contrast  Result Date: 05/24/2017 CLINICAL DATA:  Productive cough on antibiotics four days. EXAM: CT CHEST WITHOUT CONTRAST TECHNIQUE: Multidetector CT imaging of the chest was performed following the standard protocol without IV contrast. COMPARISON:  Chest x-ray 05/23/2017 FINDINGS: Cardiovascular: Heart is normal size. Calcified plaque over the left main and 3 vessel coronary arteries. Minimal calcified plaque over the thoracic aorta. Mediastinum/Nodes: No significant hilar or mediastinal adenopathy. Remaining mediastinal structures are unremarkable. Lungs/Pleura: Lungs are adequately inflated demonstrate moderate centrilobular emphysema. There is a patchy bilateral reticulonodular pattern of opacification. No evidence of effusion. Airways are within normal. Upper Abdomen: No acute abnormality. Mild calcified plaque over the abdominal aorta. Nonspecific 1 cm right retrocrural lymph node. Musculoskeletal: Mild degenerate changes spine with several Schmorl's nodes. IMPRESSION: Bilateral patchy reticulonodular pattern of opacification. Findings are likely due to an infectious or atypical infectious process such as mycobacterium avium complex or an inflammatory process. Recommend follow-up CT 4-6 weeks. Atherosclerotic coronary artery disease. Aortic Atherosclerosis (ICD10-I70.0). Electronically Signed   By: Marin Olp M.D.   On: 05/24/2017 14:03     ASSESSMENT AND PLAN:   62 year old male patient with history of tobacco abuse presented to the hospital with shortness of breath, wheezing and low oxygen saturation.  -Acute hypoxic respiratory distress secondary to COPD improving slowly Continue oxygen via nasal cannula Wean oxygen as tolerated  -New onset COPD with exacerbation and oxygen requiring Oral steroids Nebulization treatments Oral Zithromax antibiotics for atypical infection : Bronchitis  -Tobacco abuse Tobacco cessation counseled to the patient for  6 minutes Nicotine patch offered  -DVT prophylaxis subcu Lovenox daily  All the records are reviewed and case discussed with Care Management/Social Worker. Management plans discussed with the patient, family and they are in agreement.  CODE STATUS: Full code  DVT Prophylaxis: SCDs  TOTAL TIME TAKING CARE OF THIS PATIENT: 33 minutes.   POSSIBLE D/C IN 1  DAY, DEPENDING ON CLINICAL CONDITION.  Saundra Shelling M.D on 05/25/2017 at 12:27 PM  Between 7am to 6pm - Pager - [phone]  After 6pm go to [email or web address] - password [email or web address] [facility]  Office  [phone]  CC: Primary care physician; Patient, No Pcp Per  Note: This dictation was prepared with Dragon dictation along with smaller phrase technology. Any transcriptional errors that result from this process are unintentional.

## 2017-05-25 NOTE — Progress Notes (Signed)
MD made aware of qualifying sats and absence of PRN resp meds. Ordered nebs q2h for Sob and wheezing. Called respiratory for consult

## 2017-05-26 MED ORDER — PREDNISONE 10 MG PO TABS: 10.0000 mg | ORAL_TABLET | Freq: Every day | ORAL | 0 refills | Status: DC

## 2017-05-26 MED ORDER — FLUTICASONE-SALMETEROL 250-50 MCG/DOSE IN AEPB: 1.0000 | INHALATION_SPRAY | Freq: Two times a day (BID) | RESPIRATORY_TRACT | 0 refills | Status: DC

## 2017-05-26 MED ORDER — NICOTINE 21 MG/24HR TD PT24: 21.0000 mg | MEDICATED_PATCH | Freq: Every day | TRANSDERMAL | 0 refills | Status: AC

## 2017-05-26 MED ORDER — AZITHROMYCIN 250 MG PO TABS: 250.0000 mg | ORAL_TABLET | Freq: Every day | ORAL | 0 refills | Status: AC

## 2017-05-26 MED ORDER — ALBUTEROL SULFATE HFA 108 (90 BASE) MCG/ACT IN AERS: 2.0000 | INHALATION_SPRAY | Freq: Four times a day (QID) | RESPIRATORY_TRACT | 2 refills | Status: DC | PRN

## 2017-05-26 NOTE — Discharge Summary (Signed)
[facility] [place] [facility]   PATIENT NAME: Chase Burnett    MR#:  [id number]  DATE OF BIRTH:  [birth year]  DATE OF ADMISSION:  05/23/2017 ADMITTING PHYSICIAN: Arnaldo Natal, MD  DATE OF DISCHARGE: 05/26/2017  PRIMARY CARE PHYSICIAN: Patient, No Pcp Per   ADMISSION DIAGNOSIS:  Shortness of breath [R06.02] Bronchitis [J40] Hypoxia [R09.02] Chronic obstructive pulmonary disease, unspecified COPD type (HCC) [J44.9]  DISCHARGE DIAGNOSIS:  Acute respiratory distress with hypoxia Bronchitis New onset COPD Tobacco abuse  SECONDARY DIAGNOSIS:   Past Medical History:  Diagnosis Date  . Pneumothorax      ADMITTING HISTORY The patient with no chronic medical problems presents emergency department with shortness of breath.  The patient states that he has had cough and worsening shortness of breath over the last few days.  In the emergency department oxygen saturations were 88% on arrival.  He denies shortness of breath, nausea, vomiting or diaphoresis.  The patient received Solu-Medrol and multiple breathing treatments.  Oxygen saturations improved with supplemental oxygen via nasal cannula.  Due to his increased work of breathing and hypoxia the emergency department staff called the hospitalist service for admission.  HOSPITAL COURSE:  Patient patient admitted to medical floor.  He received IV Solu-Medrol and aggressive nebulization treatments.  Patient was put on oxygen via nasal cannula for hypoxia.  His shortness of breath improved.  He was weaned off the oxygen.  He was switched to oral steroids.  Tobacco cessation counseled to the patient.  Nicotine patch was also offered.  Patient received Zithromax antibiotic during the stay in the hospital.  Patient will be discharged home on tapering dose of steroids and inhaler treatments.  Advised tobacco cessation strongly. CONSULTS OBTAINED:    DRUG ALLERGIES:  No Known Allergies  DISCHARGE MEDICATIONS:    Allergies as of 05/26/2017   No Known Allergies     Medication List    TAKE these medications   albuterol 108 (90 Base) MCG/ACT inhaler Commonly known as:  PROVENTIL HFA;VENTOLIN HFA Inhale 2 puffs into the lungs every 6 (six) hours as needed for wheezing or shortness of breath.   azithromycin 250 MG tablet Commonly known as:  ZITHROMAX Take 1 tablet (250 mg total) by mouth daily for 2 days.   Fluticasone-Salmeterol 250-50 MCG/DOSE Aepb Commonly known as:  ADVAIR DISKUS Inhale 1 puff into the lungs 2 (two) times daily.   nicotine 21 mg/24hr patch Commonly known as:  NICODERM CQ - dosed in mg/24 hours Place 1 patch (21 mg total) onto the skin daily for 28 days. Start taking on:  05/27/2017   predniSONE 10 MG tablet Commonly known as:  DELTASONE Take 1 tablet (10 mg total) by mouth daily. Label  & dispense according to the schedule below.  6 tablets day one, then 5 table day 2, then 4 tablets day 3, then 3 tablets day 4, 2 tablets day 5, then 1 tablet day 6, then stop       Today  Patient seen and evaluated today Decreased shortness of breath No chest pain Weaned off oxygen  VITAL SIGNS:  Blood pressure (!) 145/91, pulse 91, temperature 97.9 F (36.6 C), temperature source Oral, resp. rate 18, height  (1.727 m), weight 64.4 kg (141 lb 15.6 oz), SpO2 98 %.  I/O:    Intake/Output Summary (Last 24 hours) at 05/26/2017 1633 Last data filed at 05/26/2017 1300 Gross per 24 hour  Intake 480 ml  Output -  Net 480 ml  PHYSICAL EXAMINATION:  Physical Exam  GENERAL:  62 y.o.-year-old patient lying in the bed with no acute distress.  LUNGS: Normal breath sounds bilaterally, no wheezing, rales,rhonchi or crepitation. No use of accessory muscles of respiration.  CARDIOVASCULAR: S1, S2 normal. No murmurs, rubs, or gallops.  ABDOMEN: Soft, non-tender, non-distended. Bowel sounds present. No organomegaly or mass.  NEUROLOGIC: Moves all 4 extremities. PSYCHIATRIC: The  patient is alert and oriented x 3.  SKIN: No obvious rash, lesion, or ulcer.   DATA REVIEW:   CBC Recent Labs  Lab 05/23/17 2156  WBC 11.3*  HGB 13.3  HCT 39.0*  PLT 315    Chemistries  Recent Labs  Lab 05/23/17 2156  NA 135  K 4.4  CL 96*  CO2 29  GLUCOSE 100*  BUN 11  CREATININE 0.63  CALCIUM 9.1    Cardiac Enzymes Recent Labs  Lab 05/23/17 2156  TROPONINI <0.03    Microbiology Results  No results found for this or any previous visit.  RADIOLOGY:  No results found.  Follow up with PCP in 1 week.  Management plans discussed with the patient, family and they are in agreement.  CODE STATUS: Full code    Code Status Orders  (From admission, onward)        Start     Ordered   05/24/17 0303  Full code  Continuous     05/24/17 0302    Code Status History    This patient has a current code status but no historical code status.      TOTAL TIME TAKING CARE OF THIS PATIENT ON DAY OF DISCHARGE: more than 35 minutes.   Ihor Austin M.D on 05/26/2017 at 4:33 PM  Between 7am to 6pm - Pager - [phone]  After 6pm go to [email or web address] - password [email or web address]  [facility]  Office  [phone]  CC: Primary care physician; Patient, No Pcp Per  Note: This dictation was prepared with Dragon dictation along with smaller phrase technology. Any transcriptional errors that result from this process are unintentional.

## 2017-05-26 NOTE — Care Management (Signed)
Patient does not qualify for home O2.

## 2017-05-26 NOTE — Progress Notes (Signed)
SATURATION QUALIFICATIONS: (This note is used to comply with regulatory documentation for home oxygen)  Patient Saturations on Room Air at Rest = 92%  Patient Saturations on Room Air while Ambulating = 89%  Patient Saturations on 0 Liters of oxygen while Ambulating = 89-91%  Please briefly explain why patient needs home oxygen:

## 2017-06-01 ENCOUNTER — Telehealth: Payer: Self-pay

## 2017-06-01 NOTE — Telephone Encounter (Signed)
EMMI Follow-up: Noted on the report that the patient had questions who to follow-up with if there was a change in his condition.  He had been concerned on Friday that he didn't have a PCP to follow-up with right away. Talked with Chase Burnett and he said he was feeling much better today and had stopped smoking after being hospitalized for bronchitis and fluid in his lungs.  Finished his antibiotics yesterday. Not sure if he will follow-up with [facility] but maybe look for a PCP.  I provided him the phone number for [facility] Physician Referral Service [phone]. No needs noted today.

## 2017-08-03 ENCOUNTER — Other Ambulatory Visit: Payer: Self-pay | Admitting: Internal Medicine

## 2017-08-03 DIAGNOSIS — R9389 Abnormal findings on diagnostic imaging of other specified body structures: Secondary | ICD-10-CM

## 2017-08-17 ENCOUNTER — Ambulatory Visit: Payer: BLUE CROSS/BLUE SHIELD

## 2018-09-03 IMAGING — CT CT CHEST W/O CM
2 of 3 series · 15 of 36 positions shown, 18 images · non-contrast
Comparison: Chest x-ray 05/23/2017

CLINICAL DATA: Productive cough on antibiotics four days.

EXAM:
CT CHEST WITHOUT CONTRAST
TECHNIQUE: Multidetector CT imaging of the chest was performed following the
standard protocol without IV contrast.

[Series 2: thorax · axial · 0.77mm/px · z∈[-300,-24]mm · 12 of 162 slices shown, 15 images]
[im 12/162  mediastinal]
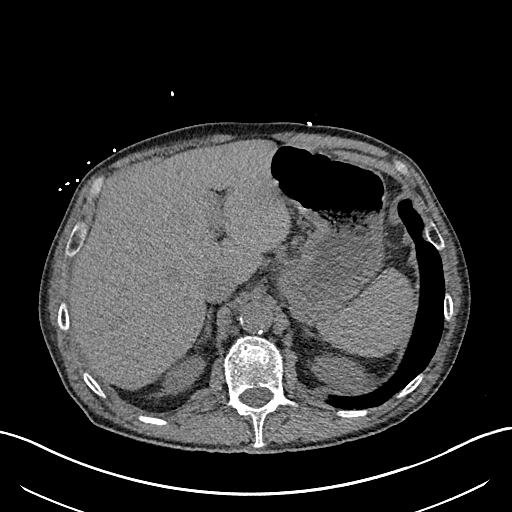
[im 12/162  lung]
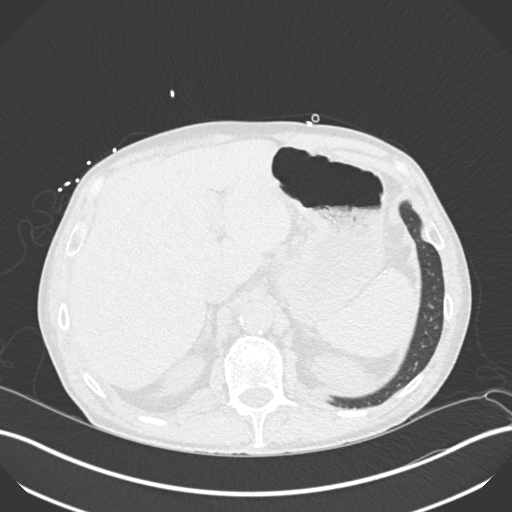
[im 24/162  lung]
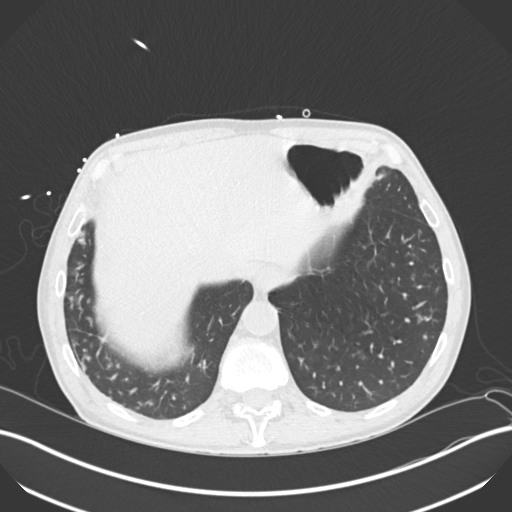
[im 36/162  lung]
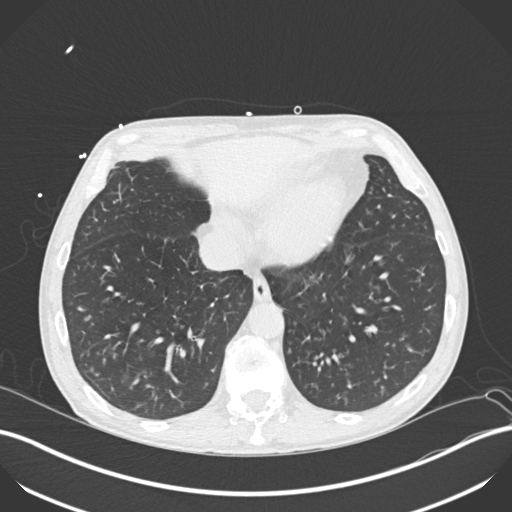
[im 48/162  lung]
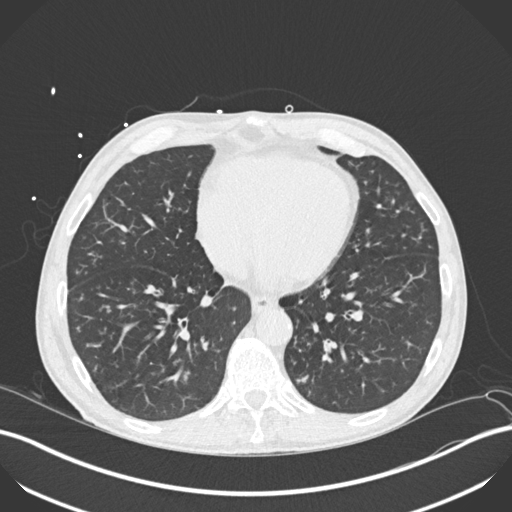
[im 60/162  mediastinal]
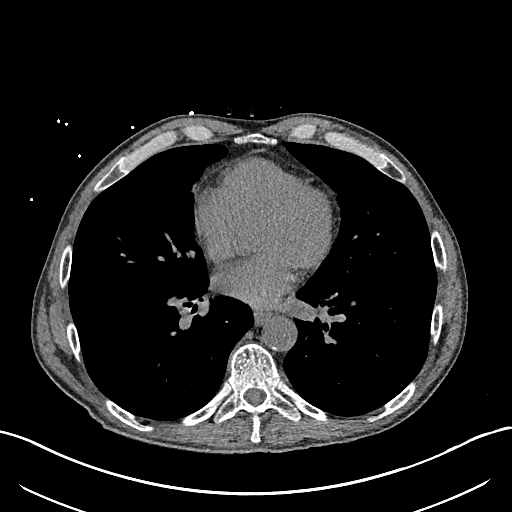
[im 60/162  lung]
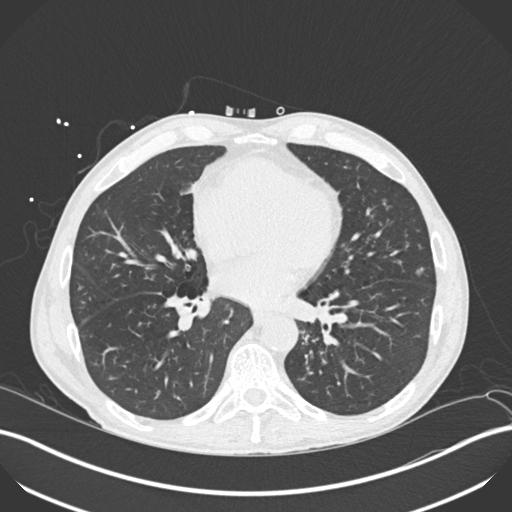
[im 72/162  lung]
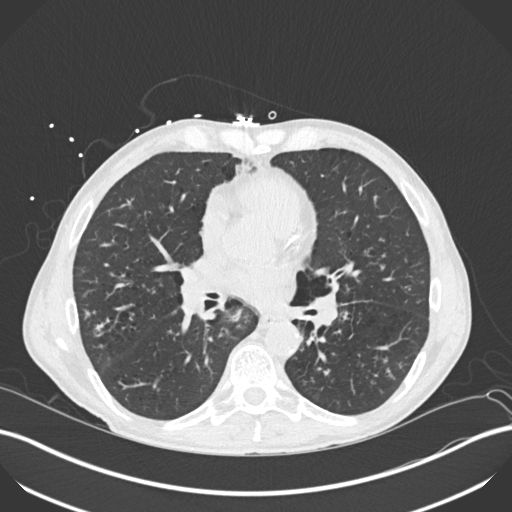
[im 90/162  lung]
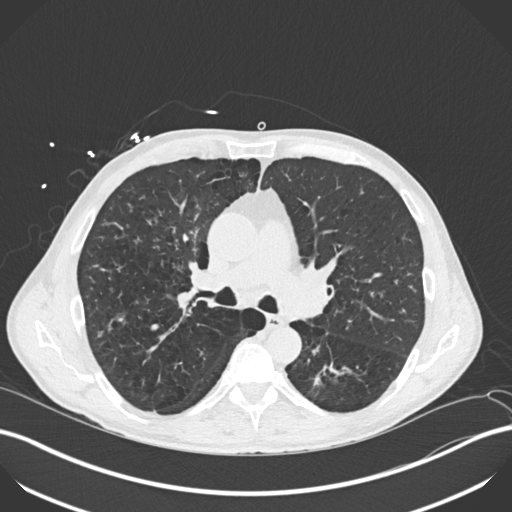
[im 102/162  lung]
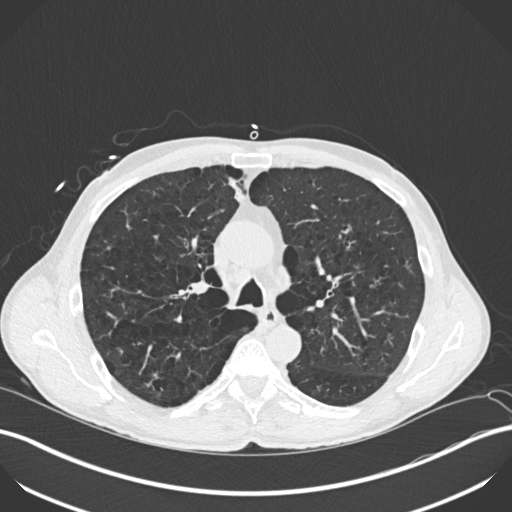
[im 114/162  mediastinal]
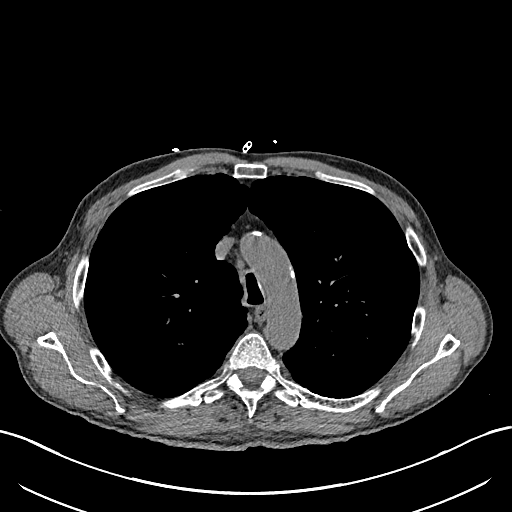
[im 114/162  lung]
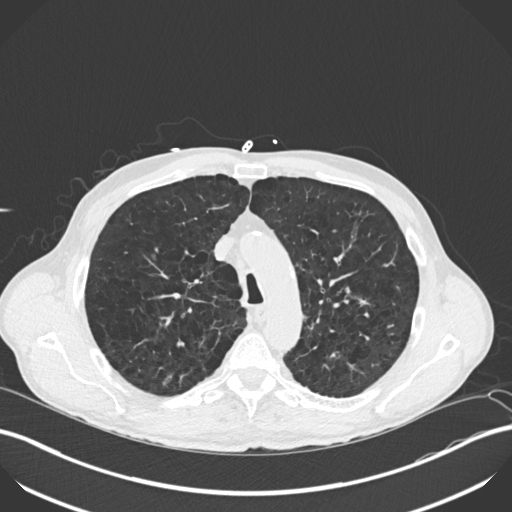
[im 126/162  lung]
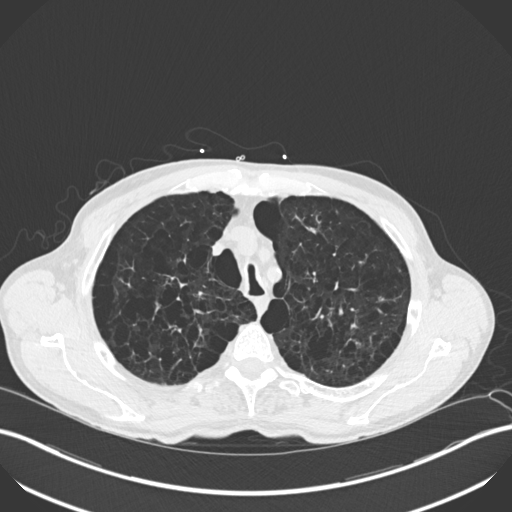
[im 138/162  lung]
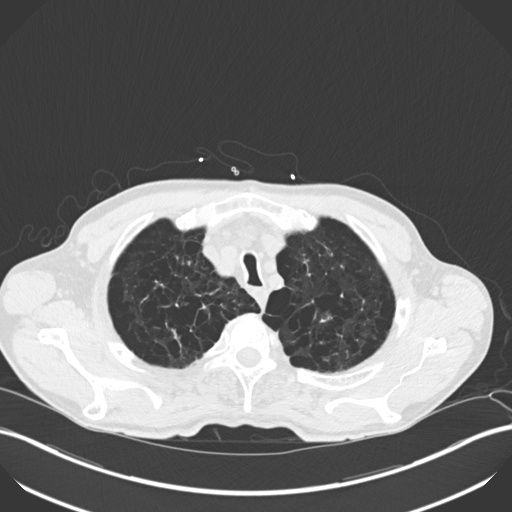
[im 150/162  lung]
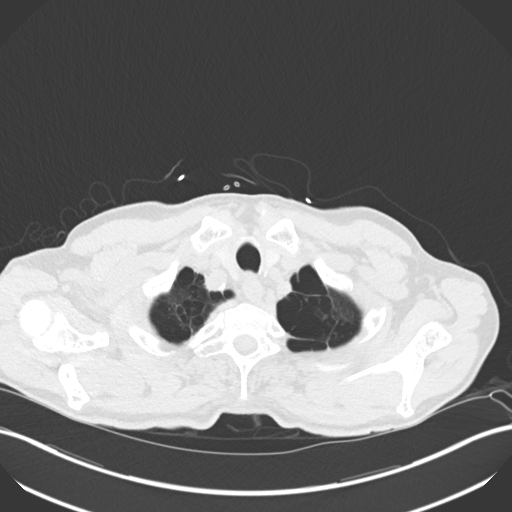

[Series 5: coronal · coronal · 0.63mm/px · 3 of 151 slices shown]
[im 31/151  lung]
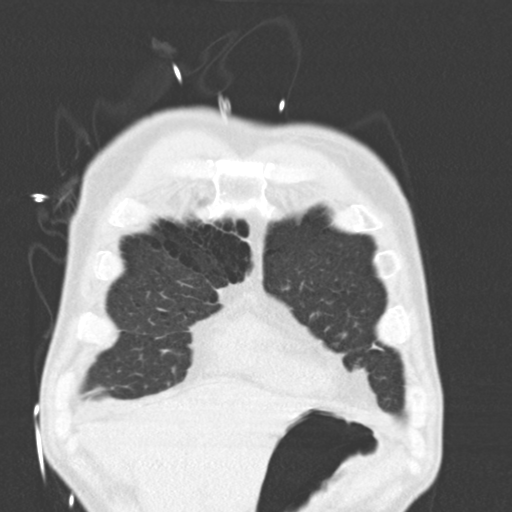
[im 61/151  lung]
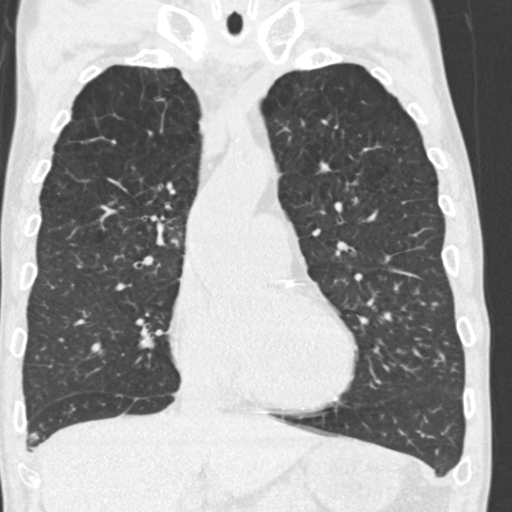
[im 91/151  lung]
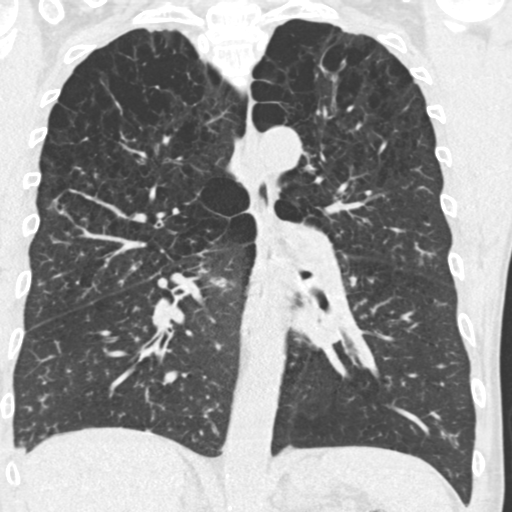

[15 of 36 positions shown; findings below may reference images not displayed]

FINDINGS: Cardiovascular: Heart is normal size. Calcified plaque over the left
main and 3 vessel coronary arteries. Minimal calcified plaque over
the thoracic aorta.

Mediastinum/Nodes: No significant hilar or mediastinal adenopathy.
Remaining mediastinal structures are unremarkable.

Lungs/Pleura: Lungs are adequately inflated demonstrate moderate
centrilobular emphysema. There is a patchy bilateral reticulonodular
pattern of opacification. No evidence of effusion. Airways are
within normal.

Upper Abdomen: No acute abnormality. Mild calcified plaque over the
abdominal aorta. Nonspecific 1 cm right retrocrural lymph node.

Musculoskeletal: Mild degenerate changes spine with several
Schmorl's nodes.
IMPRESSION: Bilateral patchy reticulonodular pattern of opacification. Findings
are likely due to an infectious or atypical infectious process such
as mycobacterium avium complex or an inflammatory process. Recommend
follow-up CT 4-6 weeks.

Atherosclerotic coronary artery disease.

Aortic Atherosclerosis ([id number]).

## 2018-10-29 ENCOUNTER — Encounter: Payer: Self-pay | Admitting: *Deleted

## 2019-01-13 ENCOUNTER — Telehealth: Payer: Self-pay

## 2019-01-13 NOTE — Telephone Encounter (Signed)
-----   Message from Avie Arenas, [place] sent at 11/18/2018  4:03 PM EST ----- Regarding: Colonoscopy Feb 2021 Feb 3rd requested date for colonoscopy at [facility].

## 2019-01-13 NOTE — Telephone Encounter (Signed)
Opened in error.    Thanks [employer]

## 2019-01-27 ENCOUNTER — Telehealth: Payer: Self-pay

## 2019-01-27 NOTE — Telephone Encounter (Signed)
-----   Message from Andrue Dini S Chanese Hartsough, CMA sent at 11/18/2018  4:03 PM EST ----- Regarding: Colonoscopy Feb 2021 Feb 3rd requested date for colonoscopy at [facility].  

## 2019-01-27 NOTE — Telephone Encounter (Signed)
LVM for pt to call office to discuss scheduling colonoscopy in February.  Thanks [place], [place]

## 2019-09-06 ENCOUNTER — Other Ambulatory Visit: Payer: Self-pay | Admitting: *Deleted

## 2019-09-06 MED ORDER — FLUTICASONE-SALMETEROL 250-50 MCG/DOSE IN AEPB: 1.0000 | INHALATION_SPRAY | Freq: Two times a day (BID) | RESPIRATORY_TRACT | 6 refills | Status: DC

## 2019-09-06 MED ORDER — ALBUTEROL SULFATE HFA 108 (90 BASE) MCG/ACT IN AERS: 2.0000 | INHALATION_SPRAY | Freq: Four times a day (QID) | RESPIRATORY_TRACT | 6 refills | Status: DC | PRN

## 2019-11-28 ENCOUNTER — Other Ambulatory Visit: Payer: Self-pay | Admitting: *Deleted

## 2019-11-28 MED ORDER — ALBUTEROL SULFATE HFA 108 (90 BASE) MCG/ACT IN AERS: 2.0000 | INHALATION_SPRAY | Freq: Four times a day (QID) | RESPIRATORY_TRACT | 3 refills | Status: DC | PRN

## 2019-11-28 MED ORDER — FLUTICASONE-SALMETEROL 250-50 MCG/DOSE IN AEPB: 1.0000 | INHALATION_SPRAY | Freq: Two times a day (BID) | RESPIRATORY_TRACT | 3 refills | Status: DC

## 2019-12-27 ENCOUNTER — Other Ambulatory Visit: Payer: Self-pay | Admitting: *Deleted

## 2019-12-27 MED ORDER — AZITHROMYCIN 250 MG PO TABS: ORAL_TABLET | ORAL | 0 refills | Status: DC

## 2020-01-09 ENCOUNTER — Encounter: Payer: Self-pay | Admitting: Nurse Practitioner

## 2020-01-09 ENCOUNTER — Ambulatory Visit (INDEPENDENT_AMBULATORY_CARE_PROVIDER_SITE_OTHER): Payer: BLUE CROSS/BLUE SHIELD | Admitting: Nurse Practitioner

## 2020-01-09 VITALS — BP 148/72 | HR 100 | Temp 97.6°F | Resp 16 | Ht 68.0 in | Wt 150.0 lb

## 2020-01-09 DIAGNOSIS — R0609 Other forms of dyspnea: Secondary | ICD-10-CM

## 2020-01-09 DIAGNOSIS — R0600 Dyspnea, unspecified: Secondary | ICD-10-CM

## 2020-01-09 DIAGNOSIS — F17210 Nicotine dependence, cigarettes, uncomplicated: Secondary | ICD-10-CM

## 2020-01-09 DIAGNOSIS — Z1211 Encounter for screening for malignant neoplasm of colon: Secondary | ICD-10-CM

## 2020-01-09 DIAGNOSIS — Z122 Encounter for screening for malignant neoplasm of respiratory organs: Secondary | ICD-10-CM

## 2020-01-09 DIAGNOSIS — F411 Generalized anxiety disorder: Secondary | ICD-10-CM

## 2020-01-09 DIAGNOSIS — J449 Chronic obstructive pulmonary disease, unspecified: Secondary | ICD-10-CM

## 2020-01-09 DIAGNOSIS — I2510 Atherosclerotic heart disease of native coronary artery without angina pectoris: Secondary | ICD-10-CM

## 2020-01-09 DIAGNOSIS — R5383 Other fatigue: Secondary | ICD-10-CM

## 2020-01-09 DIAGNOSIS — I700 Atherosclerosis of aorta: Secondary | ICD-10-CM

## 2020-01-09 MED ORDER — BREZTRI AEROSPHERE 160-9-4.8 MCG/ACT IN AERO: 2.0000 | INHALATION_SPRAY | Freq: Two times a day (BID) | RESPIRATORY_TRACT | 0 refills | Status: DC

## 2020-01-09 MED ORDER — TRAZODONE HCL 50 MG PO TABS: 25.0000 mg | ORAL_TABLET | Freq: Every evening | ORAL | 1 refills | Status: DC | PRN

## 2020-01-09 NOTE — Progress Notes (Signed)
[facility] [place], [place] [address]  Internal MEDICINE  Office Visit Note  Patient Name: Chase Burnett  [id number]  [id number]  Date of Service: 01/11/2020   Complaints/HPI Pt is here for establishment of PCP. Chief Complaint  Patient presents with  . New Patient (Initial Visit)    Over the last 6 months breathing has gotten worse, discuss meds, anxiety, difficulty sleeping sometimes, may have had bronchitis   . COPD  . Quality Metric Gaps    Colonoscopy, flu shot   The patient is here  To establish primary care provider.  -states that he was diagnosed with COPD In 2019. Uses advair twice daily. Was not taking ir at first but then didn't take it for long time as he felt fine. Over last six months, he states that his breathing has become worse. Gets short of breath with exertion. Last chest CT was done in 2019 when he was hospitalized for pneumonia.. did show coronary artery disease and aortic atherosclerosis. States that his PCP did stress test after that with no abnormal results. States that he has not followed up with PCP since then.  -patient is a smoker. -two weeks ago, felt like he had bronchitis. Was treated with z-pack. States that this did get better, however, hoarseness remains.  -has not had blood work done or other evaluation of COPD since initial diagnosis.  -needs to have routine, fasting labs -should have colon cancer screen    Current Medication: Outpatient Encounter Medications as of 01/09/2020  Medication Sig  . albuterol (VENTOLIN HFA) 108 (90 Base) MCG/ACT inhaler Inhale 2 puffs into the lungs every 6 (six) hours as needed for wheezing or shortness of breath.  . Budeson-Glycopyrrol-Formoterol (BREZTRI AEROSPHERE) 160-9-4.8 MCG/ACT AERO Inhale 2 puffs into the lungs 2 (two) times daily.  . traZODone (DESYREL) 50 MG tablet Take 0.5-1 tablets (25-50 mg total) by mouth at bedtime as needed for sleep.  . [DISCONTINUED]  Fluticasone-Salmeterol (ADVAIR DISKUS) 250-50 MCG/DOSE AEPB Inhale 1 puff into the lungs 2 (two) times daily.  . [DISCONTINUED] azithromycin (ZITHROMAX) 250 MG tablet As directed (Patient not taking: Reported on 01/09/2020)  . [DISCONTINUED] predniSONE (DELTASONE) 10 MG tablet Take 1 tablet (10 mg total) by mouth daily. Label  & dispense according to the schedule below.  6 tablets day one, then 5 table day 2, then 4 tablets day 3, then 3 tablets day 4, 2 tablets day 5, then 1 tablet day 6, then stop (Patient not taking: Reported on 01/09/2020)   No facility-administered encounter medications on file as of 01/09/2020.    Surgical History: Past Surgical History:  Procedure Laterality Date  . CHEST TUBE INSERTION      Medical History: Past Medical History:  Diagnosis Date  . COPD (chronic obstructive pulmonary disease) ([place])   . Pneumothorax     Family History: Family History  Problem Relation Age of Onset  . Cancer Mother   . Anxiety disorder Brother   . COPD Brother     Social History   Socioeconomic History  . Marital status: Married    Spouse name: Not on file  . Number of children: Not on file  . Years of education: Not on file  . Highest education level: Not on file  Occupational History  . Not on file  Tobacco Use  . Smoking status: Current Every Day Smoker    Packs/day: 1.00    Years: 40.00    Pack years: 40.00    Types: Cigarettes  .  Smokeless tobacco: Never Used  Substance and Sexual Activity  . Alcohol use: Not Currently  . Drug use: Never  . Sexual activity: Not on file  Other Topics Concern  . Not on file  Social History Narrative  . Not on file   Social Determinants of Health   Financial Resource Strain: Not on file  Food Insecurity: Not on file  Transportation Needs: Not on file  Physical Activity: Not on file  Stress: Not on file  Social Connections: Not on file  Intimate Partner Violence: Not on file     Review of Systems  Constitutional:  Positive for activity change and fatigue. Negative for chills and unexpected weight change.       States that his activity levels are decreasing due to worsening shortness of breath and fatigue  HENT: Negative for congestion, postnasal drip, rhinorrhea, sneezing and sore throat.   Respiratory: Positive for cough, chest tightness, shortness of breath and wheezing.   Cardiovascular: Negative for chest pain and palpitations.  Gastrointestinal: Negative for abdominal pain, constipation, diarrhea, nausea and vomiting.  Endocrine: Negative for cold intolerance, heat intolerance, polydipsia and polyuria.  Genitourinary: Negative for dysuria and frequency.  Musculoskeletal: Positive for arthralgias. Negative for back pain, joint swelling and neck pain.  Skin: Negative for rash.  Allergic/Immunologic: Positive for environmental allergies.  Neurological: Negative for dizziness, tremors, numbness and headaches.  Hematological: Negative for adenopathy. Does not bruise/bleed easily.  Psychiatric/Behavioral: Positive for sleep disturbance. Negative for behavioral problems (Depression) and suicidal ideas. The patient is nervous/anxious.        The patient has anxiety, especially when trying to go to sleep. It is difficult for him to breathe when lying down and sleeps, mostly, sitting up. Afraid to go all the way to sleep due to breathing.    Today's Vitals   01/09/20 1421  BP: (!) 148/72  Pulse: 100  Resp: 16  Temp: 97.6 F (36.4 C)  SpO2: 97%  Weight: 150 lb (68 kg)  Height: 5\' 8"  (1.727 m)   Body mass index is 22.81 kg/m.  Physical Exam Vitals and nursing note reviewed.  Constitutional:      General: He is not in acute distress.    Appearance: Normal appearance. He is well-developed and well-nourished. He is not diaphoretic.  HENT:     Head: Normocephalic and atraumatic.     Mouth/Throat:     Mouth: Oropharynx is clear and moist.     Pharynx: No oropharyngeal exudate.  Eyes:      Extraocular Movements: EOM normal.     Pupils: Pupils are equal, round, and reactive to light.  Neck:     Thyroid: No thyromegaly.     Vascular: Carotid bruit present. No JVD.     Trachea: No tracheal deviation.     Comments: Sot bruit auscultated, bilaterally.  Cardiovascular:     Rate and Rhythm: Normal rate and regular rhythm.     Heart sounds: Normal heart sounds. No murmur heard. No friction rub. No gallop.   Pulmonary:     Effort: Pulmonary effort is normal. No respiratory distress.     Breath sounds: Wheezing present. No rales.     Comments: Diminished breath sounds throughout the lung fields.  Chest:     Chest wall: No tenderness.  Abdominal:     General: Bowel sounds are normal.     Palpations: Abdomen is soft.     Tenderness: There is no abdominal tenderness.  Musculoskeletal:  General: Normal range of motion.     Cervical back: Normal range of motion and neck supple.  Lymphadenopathy:     Cervical: No cervical adenopathy.  Skin:    General: Skin is warm and dry.  Neurological:     Mental Status: He is alert and oriented to person, place, and time.     Cranial Nerves: No cranial nerve deficit.  Psychiatric:        Attention and Perception: Attention and perception normal.        Mood and Affect: Affect normal. Mood is anxious.        Speech: Speech normal.        Behavior: Behavior normal. Behavior is cooperative.        Thought Content: Thought content normal.        Cognition and Memory: Cognition and memory normal.        Judgment: Judgment normal.   Assessment/Plan: 1. Chronic obstructive pulmonary disease, unspecified COPD type (HCC) Patient using rescue inhaler many times during the day. Trial of Breztri - use two inhalations twice daily. Continue to use rescue inhaler as needed and as prescribed. Will get pulmonary function test and will refer to pulmonology for further evaluation.  - Pulmonary Function Test; Future - Ambulatory referral to  Pulmonology - Budeson-Glycopyrrol-Formoterol (BREZTRI AEROSPHERE) 160-9-4.8 MCG/ACT AERO; Inhale 2 puffs into the lungs 2 (two) times daily.  Dispense: 11 g; Refill: 0  2. Dyspnea on exertion Last chest CT was done in 2019 when he was hospitalized for pneumonia.. did show coronary artery disease and aortic atherosclerosis. Patient also with COPD. Trial of Breztri - use two inhalations twice daily. Continue to use rescue inhaler as needed and as prescribed. Will get pulmonary function test and will refer to pulmonology for further evaluation.  Will get echocardiogram and a CT chest for lung cancer screening.  - Pulmonary Function Test; Future - ECHOCARDIOGRAM COMPLETE; Future - Ambulatory referral to Pulmonology - CT CHEST LUNG CA SCREEN LOW DOSE W/O CM; Future - Budeson-Glycopyrrol-Formoterol (BREZTRI AEROSPHERE) 160-9-4.8 MCG/ACT AERO; Inhale 2 puffs into the lungs 2 (two) times daily.  Dispense: 11 g; Refill: 0  3. Screening for lung cancer - CT CHEST LUNG CA SCREEN LOW DOSE W/O CM; Future  4. Moderate cigarette smoker Smoking cessation discussed and encouraged. Patient states that he is currently trying to cut down and eventually quit altogether.  - CT CHEST LUNG CA SCREEN LOW DOSE W/O CM; Future - US Carotid Bilateral; Future  5. Coronary artery disease involving native coronary artery of native heart without angina pectoris CT chest done in 2019 showing coronary artery disease and aortic atherosclerosis. Will get echocardiogram and carotid doppler for further evaluation.  - ECHOCARDIOGRAM COMPLETE; Future - US Carotid Bilateral; Future  6. Aortic atherosclerosis (HCC) CT chest done in 2019 showing coronary artery disease and aortic atherosclerosis. Will get echocardiogram and carotid doppler for further evaluation.  - US Carotid Bilateral; Future  7. Generalized anxiety disorder Trial trazodone 50mg . Advised him to start with 1/2 tablet at bedtime. May increase to 1 tablet as needed  for anxiety/insomnia.  - traZODone (DESYREL) 50 MG tablet; Take 0.5-1 tablets (25-50 mg total) by mouth at bedtime as needed for sleep.  Dispense: 30 tablet; Refill: 1  8. Other fatigue Routine labs ordered, including full anemia panel  9. Screening for colon cancer Refer to GI for screening colonoscopy.  - Ambulatory referral to Gastroenterology  General Counseling: nashua homewood understanding of the findings of todays visit and  agrees with plan of treatment. I have discussed any further diagnostic evaluation that may be needed or ordered today. We also reviewed his medications today. he has been encouraged to call the office with any questions or concerns that should arise related to todays visit.    Counseling:  Smoking cessation counseling: 1. Pt acknowledges the risks of long term smoking, she will try to quite smoking. 2. Options for different medications including nicotine products, chewing gum, patch etc, Wellbutrin and Chantix is discussed 3. Goal and date of compete cessation is discussed 4. Total time spent in smoking cessation is 15 min.  This patient was seen by Vincent Gros FNP Collaboration with Dr Lyndon Code as a part of collaborative care agreement  Orders Placed This Encounter  Procedures  . CT CHEST LUNG CA SCREEN LOW DOSE W/O CM  . US Carotid Bilateral  . Ambulatory referral to Pulmonology  . Ambulatory referral to Gastroenterology  . ECHOCARDIOGRAM COMPLETE  . Pulmonary Function Test    Meds ordered this encounter  Medications  . traZODone (DESYREL) 50 MG tablet    Sig: Take 0.5-1 tablets (25-50 mg total) by mouth at bedtime as needed for sleep.    Dispense:  30 tablet    Refill:  1    Order Specific Question:   Supervising Provider    Answer:   Lyndon Code [[id number]]  . Budeson-Glycopyrrol-Formoterol (BREZTRI AEROSPHERE) 160-9-4.8 MCG/ACT AERO    Sig: Inhale 2 puffs into the lungs 2 (two) times daily.    Dispense:  11 g    Refill:  0     Samples provided today    Order Specific Question:   Supervising Provider    Answer:   Lyndon Code [[id number]]    Time spent: 45 Minutes

## 2020-01-10 ENCOUNTER — Other Ambulatory Visit: Payer: Self-pay | Admitting: Nurse Practitioner

## 2020-01-10 LAB — BASIC METABOLIC PANEL: Glucose: 110

## 2020-01-10 LAB — PSA: PSA: 0.4

## 2020-01-10 LAB — HM HEPATITIS C SCREENING LAB: HM Hepatitis Screen: NEGATIVE

## 2020-01-11 DIAGNOSIS — F17210 Nicotine dependence, cigarettes, uncomplicated: Secondary | ICD-10-CM | POA: Insufficient documentation

## 2020-01-11 DIAGNOSIS — R0600 Dyspnea, unspecified: Secondary | ICD-10-CM | POA: Insufficient documentation

## 2020-01-11 DIAGNOSIS — Z1211 Encounter for screening for malignant neoplasm of colon: Secondary | ICD-10-CM | POA: Insufficient documentation

## 2020-01-11 DIAGNOSIS — R0609 Other forms of dyspnea: Secondary | ICD-10-CM | POA: Insufficient documentation

## 2020-01-11 DIAGNOSIS — R5383 Other fatigue: Secondary | ICD-10-CM | POA: Insufficient documentation

## 2020-01-11 DIAGNOSIS — I2510 Atherosclerotic heart disease of native coronary artery without angina pectoris: Secondary | ICD-10-CM | POA: Insufficient documentation

## 2020-01-11 DIAGNOSIS — F411 Generalized anxiety disorder: Secondary | ICD-10-CM | POA: Insufficient documentation

## 2020-01-11 DIAGNOSIS — J449 Chronic obstructive pulmonary disease, unspecified: Secondary | ICD-10-CM | POA: Insufficient documentation

## 2020-01-11 DIAGNOSIS — I700 Atherosclerosis of aorta: Secondary | ICD-10-CM | POA: Insufficient documentation

## 2020-01-11 DIAGNOSIS — Z122 Encounter for screening for malignant neoplasm of respiratory organs: Secondary | ICD-10-CM | POA: Insufficient documentation

## 2020-01-11 LAB — COMPREHENSIVE METABOLIC PANEL
ALT: 14 IU/L (ref 0–44)
AST: 16 IU/L (ref 0–40)
Albumin/Globulin Ratio: 1.7 (ref 1.2–2.2)
Albumin: 4.7 g/dL (ref 3.8–4.8)
Alkaline Phosphatase: 108 IU/L (ref 44–121)
BUN/Creatinine Ratio: 15 (ref 10–24)
BUN: 12 mg/dL (ref 8–27)
Bilirubin Total: 0.5 mg/dL (ref 0.0–1.2)
CO2: 24 mmol/L (ref 20–29)
Calcium: 9.8 mg/dL (ref 8.6–10.2)
Chloride: 100 mmol/L (ref 96–106)
Creatinine, Ser: 0.78 mg/dL (ref 0.76–1.27)
GFR calc Af Amer: 111 mL/min/{1.73_m2} (ref >59)
GFR calc non Af Amer: 96 mL/min/{1.73_m2} (ref >59)
Globulin, Total: 2.7 g/dL (ref 1.5–4.5)
Glucose: 110 mg/dL — ABNORMAL HIGH (ref 65–99)
Potassium: 4.7 mmol/L (ref 3.5–5.2)
Sodium: 138 mmol/L (ref 134–144)
Total Protein: 7.4 g/dL (ref 6.0–8.5)

## 2020-01-11 LAB — TSH: TSH: 2.09 u[IU]/mL (ref 0.450–4.500)

## 2020-01-11 LAB — PSA: Prostate Specific Ag, Serum: 0.4 ng/mL (ref 0.0–4.0)

## 2020-01-11 LAB — T4, FREE: Free T4: 1.56 ng/dL (ref 0.82–1.77)

## 2020-01-11 LAB — LIPID PANEL WITH LDL/HDL RATIO
Cholesterol, Total: 193 mg/dL (ref 100–199)
HDL: 66 mg/dL (ref >39)
LDL Chol Calc (NIH): 114 mg/dL — ABNORMAL HIGH (ref 0–99)
LDL/HDL Ratio: 1.7 ratio (ref 0.0–3.6)
Triglycerides: 71 mg/dL (ref 0–149)
VLDL Cholesterol Cal: 13 mg/dL (ref 5–40)

## 2020-01-11 LAB — CBC
Hematocrit: 44.2 % (ref 37.5–51.0)
Hemoglobin: 15 g/dL (ref 13.0–17.7)
MCH: 32.8 pg (ref 26.6–33.0)
MCHC: 33.9 g/dL (ref 31.5–35.7)
MCV: 97 fL (ref 79–97)
Platelets: 303 10*3/uL (ref 150–450)
RBC: 4.57 x10E6/uL (ref 4.14–5.80)
RDW: 11.9 % (ref 11.6–15.4)
WBC: 5.4 10*3/uL (ref 3.4–10.8)

## 2020-01-11 LAB — HCV INTERPRETATION

## 2020-01-11 LAB — HCV AB W REFLEX TO QUANT PCR: HCV Ab: <0.1 s/co ratio (ref 0.0–0.9)

## 2020-01-12 NOTE — Progress Notes (Signed)
Lab work is good . Discuss at next visit 01/26/2020

## 2020-01-20 ENCOUNTER — Other Ambulatory Visit: Payer: Self-pay

## 2020-01-20 ENCOUNTER — Telehealth: Payer: Self-pay

## 2020-01-20 DIAGNOSIS — J449 Chronic obstructive pulmonary disease, unspecified: Secondary | ICD-10-CM

## 2020-01-20 DIAGNOSIS — R0600 Dyspnea, unspecified: Secondary | ICD-10-CM

## 2020-01-20 DIAGNOSIS — R0609 Other forms of dyspnea: Secondary | ICD-10-CM

## 2020-01-20 NOTE — Telephone Encounter (Signed)
Patient;s CT in review with [employer] for [employer] network authorization, thye will maill etter once determined the authorization. Chase Burnett

## 2020-01-24 ENCOUNTER — Other Ambulatory Visit: Payer: Self-pay

## 2020-01-24 DIAGNOSIS — J449 Chronic obstructive pulmonary disease, unspecified: Secondary | ICD-10-CM

## 2020-01-25 ENCOUNTER — Ambulatory Visit: Payer: BLUE CROSS/BLUE SHIELD

## 2020-01-25 ENCOUNTER — Other Ambulatory Visit: Payer: Self-pay

## 2020-01-25 DIAGNOSIS — F17210 Nicotine dependence, cigarettes, uncomplicated: Secondary | ICD-10-CM | POA: Diagnosis not present

## 2020-01-25 DIAGNOSIS — I6523 Occlusion and stenosis of bilateral carotid arteries: Secondary | ICD-10-CM

## 2020-01-25 DIAGNOSIS — I2510 Atherosclerotic heart disease of native coronary artery without angina pectoris: Secondary | ICD-10-CM

## 2020-01-25 DIAGNOSIS — I700 Atherosclerosis of aorta: Secondary | ICD-10-CM | POA: Diagnosis not present

## 2020-01-26 ENCOUNTER — Ambulatory Visit: Payer: BLUE CROSS/BLUE SHIELD | Admitting: Hospice and Palliative Medicine

## 2020-01-29 NOTE — Procedures (Signed)
[facility] [address] [place], [place] [address]  DATE OF SERVICE: January 25, 2020  CAROTID DOPPLER INTERPRETATION:  Bilateral Carotid Ultrsasound and Color Doppler Examination was performed. The RIGHT CCA shows mild plaque in the vessel. The LEFT CCA shows mild plaque in the vessel. There was no significant intimal thickening noted in the RIGHT carotid artery. There was no significant intimal thickening in the LEFT carotid artery.  The RIGHT CCA shows peak systolic velocity of 93 cm per second. The end diastolic velocity is 25 cm per second on the RIGHT side. The RIGHT ICA shows peak systolic velocity of 82 per second. RIGHT sided ICA end diastolic velocity is 26 cm per second. The RIGHT ECA shows a peak systolic velocity of 76 cm per second. The ICA/CCA ratio is calculated to be 0.55. This suggests less than 50% stenosis. The Vertebral Artery shows antegrade flow.  The LEFT CCA shows peak systolic velocity of 98 cm per second. The end diastolic velocity is 28 cm per second on the LEFT side. The LEFT ICA shows peak systolic velocity of 88 per second. LEFT sided ICA end diastolic velocity is 29 cm per second. The LEFT ECA shows a peak systolic velocity of 106 cm per second. The ICA/CCA ratio is calculated to be 0.89. This suggests less than 50% stenosis. The Vertebral Artery shows antegrade flow.   Impression:    The RIGHT CAROTID shows less than 50% stenosis. The LEFT CAROTID shows less than 50% stenosis.  There is mild plaque formation noted on the LEFT and mild plaque on the RIGHT  side. Consider a repeat Carotid doppler if clinical situation and symptoms warrant in 6-12 months. Patient should be encouraged to change lifestyles such as smoking cessation, regular exercise and dietary modification. Use of statins in the right clinical setting and ASA is encouraged.  Yevonne Pax, MD [facility] Pulmonary Critical Care Medicine

## 2020-02-01 ENCOUNTER — Ambulatory Visit: Payer: BLUE CROSS/BLUE SHIELD | Admitting: Internal Medicine

## 2020-02-01 ENCOUNTER — Ambulatory Visit: Payer: BLUE CROSS/BLUE SHIELD

## 2020-02-01 DIAGNOSIS — R0602 Shortness of breath: Secondary | ICD-10-CM

## 2020-02-01 DIAGNOSIS — I2510 Atherosclerotic heart disease of native coronary artery without angina pectoris: Secondary | ICD-10-CM

## 2020-02-01 DIAGNOSIS — R0600 Dyspnea, unspecified: Secondary | ICD-10-CM

## 2020-02-01 DIAGNOSIS — R0609 Other forms of dyspnea: Secondary | ICD-10-CM

## 2020-02-07 ENCOUNTER — Ambulatory Visit: Payer: BLUE CROSS/BLUE SHIELD

## 2020-02-08 ENCOUNTER — Other Ambulatory Visit: Payer: BLUE CROSS/BLUE SHIELD

## 2020-02-10 ENCOUNTER — Ambulatory Visit: Payer: BLUE CROSS/BLUE SHIELD | Admitting: Hospice and Palliative Medicine

## 2020-02-13 ENCOUNTER — Ambulatory Visit: Payer: BLUE CROSS/BLUE SHIELD | Admitting: Internal Medicine

## 2020-02-15 ENCOUNTER — Ambulatory Visit: Payer: BLUE CROSS/BLUE SHIELD | Admitting: Internal Medicine

## 2020-02-15 ENCOUNTER — Other Ambulatory Visit: Payer: Self-pay

## 2020-02-15 DIAGNOSIS — J449 Chronic obstructive pulmonary disease, unspecified: Secondary | ICD-10-CM

## 2020-02-15 DIAGNOSIS — R0602 Shortness of breath: Secondary | ICD-10-CM | POA: Diagnosis not present

## 2020-02-23 ENCOUNTER — Other Ambulatory Visit: Payer: Self-pay

## 2020-02-23 ENCOUNTER — Encounter: Payer: Self-pay | Admitting: Physician Assistant

## 2020-02-23 ENCOUNTER — Ambulatory Visit: Payer: BLUE CROSS/BLUE SHIELD | Admitting: Physician Assistant

## 2020-02-23 DIAGNOSIS — J449 Chronic obstructive pulmonary disease, unspecified: Secondary | ICD-10-CM | POA: Diagnosis not present

## 2020-02-23 DIAGNOSIS — F411 Generalized anxiety disorder: Secondary | ICD-10-CM

## 2020-02-23 DIAGNOSIS — I5189 Other ill-defined heart diseases: Secondary | ICD-10-CM

## 2020-02-23 DIAGNOSIS — F17219 Nicotine dependence, cigarettes, with unspecified nicotine-induced disorders: Secondary | ICD-10-CM | POA: Diagnosis not present

## 2020-02-23 DIAGNOSIS — R0600 Dyspnea, unspecified: Secondary | ICD-10-CM

## 2020-02-23 DIAGNOSIS — E7800 Pure hypercholesterolemia, unspecified: Secondary | ICD-10-CM

## 2020-02-23 DIAGNOSIS — I6523 Occlusion and stenosis of bilateral carotid arteries: Secondary | ICD-10-CM | POA: Diagnosis not present

## 2020-02-23 DIAGNOSIS — R490 Dysphonia: Secondary | ICD-10-CM

## 2020-02-23 MED ORDER — BREZTRI AEROSPHERE 160-9-4.8 MCG/ACT IN AERO: 2.0000 | INHALATION_SPRAY | Freq: Two times a day (BID) | RESPIRATORY_TRACT | 0 refills | Status: DC

## 2020-02-23 MED ORDER — TRAZODONE HCL 50 MG PO TABS: 25.0000 mg | ORAL_TABLET | Freq: Every evening | ORAL | 1 refills | Status: DC | PRN

## 2020-02-23 MED ORDER — ATORVASTATIN CALCIUM 10 MG PO TABS: 10.0000 mg | ORAL_TABLET | Freq: Every day | ORAL | 2 refills | Status: DC

## 2020-02-23 NOTE — Progress Notes (Signed)
[facility] [address] [place], [place] [address]  Internal MEDICINE  Office Visit Note  Patient Name: Chase Burnett  [id number]  [id number]  Date of Service: 02/25/2020  Chief Complaint  Patient presents with  . review test results    HPI Pt returns today for test results. -Carotid doppler reviewed with pt and educated that he needs statin therapy. We also need him to schedule CT chest, and review PFTs for pulm consult. He said CT chest was too expensive at the location scheduled, he would like to look at alternative locations. -Still smokes 1ppd will try to work on getting down to 1/2ppd -Did PFT last week but cant see results. -Echo showed diastolic dysfunction -COPD: Doing well with breztri inhaler. -Taking trazodone 1 tab per night and is working well for his sleep. -Colononscopy was denied by insurance, brother had polyps and he has never had screening. Will look into this further.  -He mentions he chokes on liquids sometimes and has hoarseness. Discussed that we may need to do a barium swallow study, which pt is open to in the future, but wants to focus on other testing first.  Current Medication: Outpatient Encounter Medications as of 02/23/2020  Medication Sig  . albuterol (VENTOLIN HFA) 108 (90 Base) MCG/ACT inhaler Inhale 2 puffs into the lungs every 6 (six) hours as needed for wheezing or shortness of breath.  Marland Kitchen atorvastatin (LIPITOR) 10 MG tablet Take 1 tablet (10 mg total) by mouth daily.  . Budeson-Glycopyrrol-Formoterol (BREZTRI AEROSPHERE) 160-9-4.8 MCG/ACT AERO Inhale 2 puffs into the lungs 2 (two) times daily.  . [DISCONTINUED] Budeson-Glycopyrrol-Formoterol (BREZTRI AEROSPHERE) 160-9-4.8 MCG/ACT AERO Inhale 2 puffs into the lungs 2 (two) times daily.  . [DISCONTINUED] traZODone (DESYREL) 50 MG tablet Take 0.5-1 tablets (25-50 mg total) by mouth at bedtime as needed for sleep.  . traZODone (DESYREL) 50 MG tablet Take 0.5-1 tablets (25-50 mg  total) by mouth at bedtime as needed for sleep.   No facility-administered encounter medications on file as of 02/23/2020.    Surgical History: Past Surgical History:  Procedure Laterality Date  . CHEST TUBE INSERTION      Medical History: Past Medical History:  Diagnosis Date  . COPD (chronic obstructive pulmonary disease) (HCC)   . Pneumothorax     Family History: Family History  Problem Relation Age of Onset  . Cancer Mother   . Anxiety disorder Brother   . COPD Brother     Social History   Socioeconomic History  . Marital status: Married    Spouse name: Not on file  . Number of children: Not on file  . Years of education: Not on file  . Highest education level: Not on file  Occupational History  . Not on file  Tobacco Use  . Smoking status: Current Every Day Smoker    Packs/day: 1.00    Years: 40.00    Pack years: 40.00    Types: Cigarettes  . Smokeless tobacco: Never Used  Substance and Sexual Activity  . Alcohol use: Not Currently  . Drug use: Never  . Sexual activity: Not on file  Other Topics Concern  . Not on file  Social History Narrative  . Not on file   Social Determinants of Health   Financial Resource Strain: Not on file  Food Insecurity: Not on file  Transportation Needs: Not on file  Physical Activity: Not on file  Stress: Not on file  Social Connections: Not on file  Intimate Partner Violence: Not  on file      Review of Systems  Constitutional: Negative for chills, fatigue and unexpected weight change.  HENT: Positive for trouble swallowing and voice change. Negative for congestion, postnasal drip, rhinorrhea, sneezing and sore throat.   Eyes: Negative for redness.  Respiratory: Positive for cough and shortness of breath. Negative for chest tightness.   Cardiovascular: Negative for chest pain and palpitations.  Gastrointestinal: Negative for abdominal pain, constipation, diarrhea, nausea and vomiting.  Genitourinary: Negative for  dysuria and frequency.  Musculoskeletal: Negative for arthralgias, back pain, joint swelling and neck pain.  Skin: Negative for rash.  Neurological: Negative.  Negative for tremors and numbness.  Hematological: Negative for adenopathy. Does not bruise/bleed easily.  Psychiatric/Behavioral: Negative for behavioral problems (Depression), sleep disturbance and suicidal ideas. The patient is nervous/anxious.     Vital Signs: BP 126/60   Pulse 99   Temp 97.9 F (36.6 C)   Resp 16   Ht 5\' 6"  (1.676 m)   Wt 145 lb 9.6 oz (66 kg)   SpO2 95%   BMI 23.50 kg/m    Physical Exam Constitutional:      General: He is not in acute distress.    Appearance: He is well-developed. He is not diaphoretic.  HENT:     Head: Normocephalic and atraumatic.     Mouth/Throat:     Pharynx: No oropharyngeal exudate.  Eyes:     Pupils: Pupils are equal, round, and reactive to light.  Neck:     Thyroid: No thyromegaly.     Vascular: No JVD.     Trachea: No tracheal deviation.  Cardiovascular:     Rate and Rhythm: Normal rate and regular rhythm.     Heart sounds: Normal heart sounds. No murmur heard. No friction rub. No gallop.   Pulmonary:     Effort: Pulmonary effort is normal. No respiratory distress.     Breath sounds: Wheezing present. No rales.  Chest:     Chest wall: No tenderness.  Abdominal:     General: Bowel sounds are normal.     Palpations: Abdomen is soft.  Musculoskeletal:        General: Normal range of motion.     Cervical back: Normal range of motion and neck supple.  Lymphadenopathy:     Cervical: No cervical adenopathy.  Skin:    General: Skin is warm and dry.  Neurological:     Mental Status: He is alert and oriented to person, place, and time.     Cranial Nerves: No cranial nerve deficit.  Psychiatric:        Behavior: Behavior normal.        Thought Content: Thought content normal.        Judgment: Judgment normal.        Assessment/Plan: 1. Chronic obstructive  pulmonary disease, unspecified COPD type (HCC) Continue Breztri inhaler. Will be scheduled for CT chest and pulmonology visit. - Budeson-Glycopyrrol-Formoterol (BREZTRI AEROSPHERE) 160-9-4.8 MCG/ACT AERO; Inhale 2 puffs into the lungs 2 (two) times daily.  Dispense: 11 g; Refill: 0  2. Atherosclerosis of both carotid arteries Carotid showed <50% stenosis bilaterally. Will start on statin and continue to monitor. - atorvastatin (LIPITOR) 10 MG tablet; Take 1 tablet (10 mg total) by mouth daily.  Dispense: 30 tablet; Refill: 2  3. Diastolic dysfunction Recent echo showed diastolic dysfunction with normal EF. Continue to monitor.  4. Generalized anxiety disorder Continue Trazodone at bed to help anxiety and sleep. - traZODone (DESYREL) 50 MG  tablet; Take 0.5-1 tablets (25-50 mg total) by mouth at bedtime as needed for sleep.  Dispense: 30 tablet; Refill: 1  5. Hypercholesteremia - atorvastatin (LIPITOR) 10 MG tablet; Take 1 tablet (10 mg total) by mouth daily.  Dispense: 30 tablet; Refill: 2  6. Hoarseness of voice Pt experiencing hoarseness and some difficulty swallowing liquids. Discussed further diagnostics such as barium swallow study, but pt wants to defer for the time being until his other testing is done. He will continue to monitor and let us know if worsening.  7. Cigarette nicotine dependence with nicotine-induced disorder Smoking cessation counseling: 1. Pt acknowledges the risks of long term smoking, she will try to quite smoking. 2. Options for different medications including nicotine products, chewing gum, patch etc, Wellbutrin and Chantix is discussed 3. Goal and date of compete cessation is discussed 4. Total time spent in smoking cessation is 10 min.  General Counseling: maleki hippe understanding of the findings of todays visit and agrees with plan of treatment. I have discussed any further diagnostic evaluation that may be needed or ordered today. We also reviewed  his medications today. he has been encouraged to call the office with any questions or concerns that should arise related to todays visit.    No orders of the defined types were placed in this encounter.   Meds ordered this encounter  Medications  . Budeson-Glycopyrrol-Formoterol (BREZTRI AEROSPHERE) 160-9-4.8 MCG/ACT AERO    Sig: Inhale 2 puffs into the lungs 2 (two) times daily.    Dispense:  11 g    Refill:  0    Samples provided today  . traZODone (DESYREL) 50 MG tablet    Sig: Take 0.5-1 tablets (25-50 mg total) by mouth at bedtime as needed for sleep.    Dispense:  30 tablet    Refill:  1  . atorvastatin (LIPITOR) 10 MG tablet    Sig: Take 1 tablet (10 mg total) by mouth daily.    Dispense:  30 tablet    Refill:  2    Total time spent: 40 Minutes Time spent includes review of chart, medications, test results, and follow up plan with the patient.      Dr Lyndon Code Internal medicine

## 2020-02-29 ENCOUNTER — Other Ambulatory Visit: Payer: Self-pay | Admitting: Hospice and Palliative Medicine

## 2020-02-29 DIAGNOSIS — Z122 Encounter for screening for malignant neoplasm of respiratory organs: Secondary | ICD-10-CM

## 2020-03-02 ENCOUNTER — Telehealth: Payer: Self-pay

## 2020-03-02 NOTE — Telephone Encounter (Signed)
Pt advised samples ready for pickup for breztri

## 2020-03-06 ENCOUNTER — Telehealth: Payer: Self-pay | Admitting: *Deleted

## 2020-03-06 NOTE — Telephone Encounter (Signed)
Received referral for low dose lung cancer screening CT scan. Message left at phone number listed in EMR for patient to call me back to facilitate scheduling scan.  

## 2020-03-06 NOTE — Procedures (Signed)
[facility] [address] [place], [address]  DATE OF SERVICE: February 15, 2020  Complete Pulmonary Function Testing Interpretation:  FINDINGS:  The forced vital capacity is moderately decreased.  FEV1 is 0.92 L which is 28% of predicted and is severely decreased.  Postbronchodilator there is no significant improvement.  FEV1 FVC ratio is severely decreased.  Total lung capacity is normal residual volume increased residual volume total lung capacity ratio is increased FRC is increased.  DLCO was moderately decreased  IMPRESSION:  This pulmonary function study is consistent with severe obstructive lung disease.  Yevonne Pax, MD [facility] Pulmonary Critical Care Medicine Sleep Medicine

## 2020-03-08 ENCOUNTER — Other Ambulatory Visit: Payer: Self-pay | Admitting: Nurse Practitioner

## 2020-03-08 DIAGNOSIS — F411 Generalized anxiety disorder: Secondary | ICD-10-CM

## 2020-03-15 ENCOUNTER — Encounter: Payer: Self-pay | Admitting: Internal Medicine

## 2020-03-15 ENCOUNTER — Ambulatory Visit (INDEPENDENT_AMBULATORY_CARE_PROVIDER_SITE_OTHER): Payer: BLUE CROSS/BLUE SHIELD | Admitting: Internal Medicine

## 2020-03-15 VITALS — BP 120/72 | HR 94 | Temp 97.7°F | Resp 16 | Ht 66.0 in | Wt 143.0 lb

## 2020-03-15 DIAGNOSIS — J449 Chronic obstructive pulmonary disease, unspecified: Secondary | ICD-10-CM | POA: Diagnosis not present

## 2020-03-15 DIAGNOSIS — R0602 Shortness of breath: Secondary | ICD-10-CM | POA: Diagnosis not present

## 2020-03-15 DIAGNOSIS — F17219 Nicotine dependence, cigarettes, with unspecified nicotine-induced disorders: Secondary | ICD-10-CM

## 2020-03-15 DIAGNOSIS — A310 Pulmonary mycobacterial infection: Secondary | ICD-10-CM | POA: Diagnosis not present

## 2020-03-15 DIAGNOSIS — J849 Interstitial pulmonary disease, unspecified: Secondary | ICD-10-CM

## 2020-03-15 MED ORDER — TRELEGY ELLIPTA 100-62.5-25 MCG/INH IN AEPB: 1.0000 | INHALATION_SPRAY | Freq: Every day | RESPIRATORY_TRACT | 4 refills | Status: DC

## 2020-03-15 NOTE — Patient Instructions (Signed)
Chronic Obstructive Pulmonary Disease  Chronic obstructive pulmonary disease (COPD) is a long-term (chronic) lung problem. When you have COPD, it is hard for air to get in and out of your lungs. Usually the condition gets worse over time, and your lungs will never return to normal. There are things you can do to keep yourself as healthy as possible. What are the causes?  Smoking. This is the most common cause.  Certain genes passed from parent to child (inherited). What increases the risk?  Being exposed to secondhand smoke from cigarettes, pipes, or cigars.  Being exposed to chemicals and other irritants, such as fumes and dust in the work environment.  Having chronic lung conditions or infections. What are the signs or symptoms?  Shortness of breath, especially during physical activity.  A long-term cough with a large amount of thick mucus. Sometimes, the cough may not have any mucus (dry cough).  Wheezing.  Breathing quickly.  Skin that looks gray or blue, especially in the fingers, toes, or lips.  Feeling tired (fatigue).  Weight loss.  Chest tightness.  Having infections often.  Episodes when breathing symptoms become much worse (exacerbations). At the later stages of this disease, you may have swelling in the ankles, feet, or legs. How is this treated?  Taking medicines.  Quitting smoking, if you smoke.  Rehabilitation. This includes steps to make your body work better. It may involve a team of specialists.  Doing exercises.  Making changes to your diet.  Using oxygen.  Lung surgery.  Lung transplant.  Comfort measures (palliative care). Follow these instructions at home: Medicines  Take over-the-counter and prescription medicines only as told by your doctor.  Talk to your doctor before taking any cough or allergy medicines. You may need to avoid medicines that cause your lungs to be dry. Lifestyle  If you smoke, stop smoking. Smoking makes the  problem worse.  Do not smoke or use any products that contain nicotine or tobacco. If you need help quitting, ask your doctor.  Avoid being around things that make your breathing worse. This may include smoke, chemicals, and fumes.  Stay active, but remember to rest as well.  Learn and use tips on how to manage stress and control your breathing.  Make sure you get enough sleep. Most adults need at least 7 hours of sleep every night.  Eat healthy foods. Eat smaller meals more often. Rest before meals. Controlled breathing Learn and use tips on how to control your breathing as told by your doctor. Try:  Breathing in (inhaling) through your nose for 1 second. Then, pucker your lips and breath out (exhale) through your lips for 2 seconds.  Putting one hand on your belly (abdomen). Breathe in slowly through your nose for 1 second. Your hand on your belly should move out. Pucker your lips and breathe out slowly through your lips. Your hand on your belly should move in as you breathe out.   Controlled coughing Learn and use controlled coughing to clear mucus from your lungs. Follow these steps: 1. Lean your head a little forward. 2. Breathe in deeply. 3. Try to hold your breath for 3 seconds. 4. Keep your mouth slightly open while coughing 2 times. 5. Spit any mucus out into a tissue. 6. Rest and do the steps again 1 or 2 times as needed. General instructions  Make sure you get all the shots (vaccines) that your doctor recommends. Ask your doctor about a flu shot and a pneumonia shot.    Use oxygen therapy and pulmonary rehabilitation if told by your doctor. If you need home oxygen therapy, ask your doctor if you should buy a tool to measure your oxygen level (oximeter).  Make a COPD action plan with your doctor. This helps you to know what to do if you feel worse than usual.  Manage any other conditions you have as told by your doctor.  Avoid going outside when it is very hot, cold, or  humid.  Avoid people who have a sickness you can catch (contagious).  Keep all follow-up visits. Contact a doctor if:  You cough up more mucus than usual.  There is a change in the color or thickness of the mucus.  It is harder to breathe than usual.  Your breathing is faster than usual.  You have trouble sleeping.  You need to use your medicines more often than usual.  You have trouble doing your normal activities such as getting dressed or walking around the house. Get help right away if:  You have shortness of breath while resting.  You have shortness of breath that stops you from: ? Being able to talk. ? Doing normal activities.  Your chest hurts for longer than 5 minutes.  Your skin color is more blue than usual.  Your pulse oximeter shows that you have low oxygen for longer than 5 minutes.  You have a fever.  You feel too tired to breathe normally. These symptoms may represent a serious problem that is an emergency. Do not wait to see if the symptoms will go away. Get medical help right away. Call your local emergency services (911 in the [place] Do not drive yourself to the hospital. Summary  Chronic obstructive pulmonary disease (COPD) is a long-term lung problem.  The way your lungs work will never return to normal. Usually the condition gets worse over time. There are things you can do to keep yourself as healthy as possible.  Take over-the-counter and prescription medicines only as told by your doctor.  If you smoke, stop. Smoking makes the problem worse. This information is not intended to replace advice given to you by your health care provider. Make sure you discuss any questions you have with your health care provider. Document Revised: 11/01/2019 Document Reviewed: 11/01/2019 Elsevier Patient Education  2021 Elsevier Inc.   

## 2020-03-15 NOTE — Progress Notes (Signed)
[facility] [place], [place] [address]  Pulmonary Sleep Medicine   Office Visit Note  Patient Name: Chase Burnett DOB: 25-May-1955 MRN [id number]  Date of Service: 03/15/2020  Complaints/HPI: COPD. He had PFT done which shows presence of SEVERE COPD. Patient is actively smoking for about 40 years. Patient states he has never been told he has COPD and currently is on advair for the last year. Patient had been on advair but was started on breztri. Patient feels that it does not help all the way through the day. He also is on ventolin. Patient had a CT scan done back in 2019 which showed possible MAI. Patient has not had a real follow up for that. He is scheduled for a lung screening CT. Because of the MAI concern I will order a follow up scan. He may actually need HRCT  ROS  General: (-) fever, (-) chills, (-) night sweats, (-) weakness Skin: (-) rashes, (-) itching,. Eyes: (-) visual changes, (-) redness, (-) itching. Nose and Sinuses: (-) nasal stuffiness or itchiness, (-) postnasal drip, (-) nosebleeds, (-) sinus trouble. Mouth and Throat: (-) sore throat, (-) hoarseness. Neck: (-) swollen glands, (-) enlarged thyroid, (-) neck pain. Respiratory: + cough, (-) bloody sputum, + shortness of breath, + wheezing. Cardiovascular: - ankle swelling, (-) chest pain. Lymphatic: (-) lymph node enlargement. Neurologic: (-) numbness, (-) tingling. Psychiatric: (-) anxiety, (-) depression   Current Medication: Outpatient Encounter Medications as of 03/15/2020  Medication Sig  . atorvastatin (LIPITOR) 10 MG tablet Take 1 tablet (10 mg total) by mouth daily.  . Budeson-Glycopyrrol-Formoterol (BREZTRI AEROSPHERE) 160-9-4.8 MCG/ACT AERO Inhale 2 puffs into the lungs 2 (two) times daily.  . traZODone (DESYREL) 50 MG tablet Take 0.5-1 tablets (25-50 mg total) by mouth at bedtime as needed for sleep.  Marland Kitchen albuterol (VENTOLIN HFA) 108 (90 Base) MCG/ACT inhaler Inhale 2 puffs  into the lungs every 6 (six) hours as needed for wheezing or shortness of breath.   No facility-administered encounter medications on file as of 03/15/2020.    Surgical History: Past Surgical History:  Procedure Laterality Date  . CHEST TUBE INSERTION      Medical History: Past Medical History:  Diagnosis Date  . COPD (chronic obstructive pulmonary disease) ([place])   . Pneumothorax     Family History: Family History  Problem Relation Age of Onset  . Cancer Mother   . Anxiety disorder Brother   . COPD Brother     Social History: Social History   Socioeconomic History  . Marital status: Married    Spouse name: Not on file  . Number of children: Not on file  . Years of education: Not on file  . Highest education level: Not on file  Occupational History  . Not on file  Tobacco Use  . Smoking status: Current Every Day Smoker    Packs/day: 1.00    Years: 40.00    Pack years: 40.00    Types: Cigarettes  . Smokeless tobacco: Never Used  Substance and Sexual Activity  . Alcohol use: Not Currently  . Drug use: Never  . Sexual activity: Not on file  Other Topics Concern  . Not on file  Social History Narrative  . Not on file   Social Determinants of Health   Financial Resource Strain: Not on file  Food Insecurity: Not on file  Transportation Needs: Not on file  Physical Activity: Not on file  Stress: Not on file  Social Connections: Not  on file  Intimate Partner Violence: Not on file    Vital Signs: Blood pressure 120/72, pulse 94, temperature 97.7 F (36.5 C), resp. rate 16, height _0  (1.676 m), weight 143 lb (64.9 kg), SpO2 98 %.  Examination: General Appearance: The patient is well-developed, well-nourished, and in no distress. Skin: Gross inspection of skin unremarkable. Head: normocephalic, no gross deformities. Eyes: no gross deformities noted. ENT: ears appear grossly normal no exudates. Neck: Supple. No thyromegaly. No LAD. Respiratory: no  rhonchi distant BS. Cardiovascular: Normal S1 and S2 without murmur or rub. Extremities: No cyanosis. pulses are equal. Neurologic: Alert and oriented. No involuntary movements.  LABS: Recent Results (from the past 2160 hour(s))  Comprehensive metabolic panel     Status: Abnormal   Collection Time: 01/10/20  8:16 AM  Result Value Ref Range   Glucose 110 (H) 65 - 99 mg/dL   BUN 12 8 - 27 mg/dL   Creatinine, Ser 0.78 0.76 - 1.27 mg/dL   GFR calc non Af Amer 96 >59 mL/min/1.73   GFR calc Af Amer 111 >59 mL/min/1.73    Comment: **In accordance with recommendations from the NKF-ASN Task force,**   [facility] is in the process of updating its eGFR calculation to the   2021 CKD-EPI creatinine equation that estimates kidney function   without a race variable.    BUN/Creatinine Ratio 15 10 - 24   Sodium 138 134 - 144 mmol/L   Potassium 4.7 3.5 - 5.2 mmol/L   Chloride 100 96 - 106 mmol/L   CO2 24 20 - 29 mmol/L   Calcium 9.8 8.6 - 10.2 mg/dL   Total Protein 7.4 6.0 - 8.5 g/dL   Albumin 4.7 3.8 - 4.8 g/dL   Globulin, Total 2.7 1.5 - 4.5 g/dL   Albumin/Globulin Ratio 1.7 1.2 - 2.2   Bilirubin Total 0.5 0.0 - 1.2 mg/dL   Alkaline Phosphatase 108 44 - 121 IU/L    Comment:               **Please note reference interval change**   AST 16 0 - 40 IU/L   ALT 14 0 - 44 IU/L  CBC     Status: None   Collection Time: 01/10/20  8:16 AM  Result Value Ref Range   WBC 5.4 3.4 - 10.8 x10E3/uL   RBC 4.57 4.14 - 5.80 x10E6/uL   Hemoglobin 15.0 13.0 - 17.7 g/dL   Hematocrit 44.2 37.5 - 51.0 %   MCV 97 79 - 97 fL   MCH 32.8 26.6 - 33.0 pg   MCHC 33.9 31.5 - 35.7 g/dL   RDW 11.9 11.6 - 15.4 %   Platelets 303 150 - 450 x10E3/uL  Lipid Panel With LDL/HDL Ratio     Status: Abnormal   Collection Time: 01/10/20  8:16 AM  Result Value Ref Range   Cholesterol, Total 193 100 - 199 mg/dL   Triglycerides 71 0 - 149 mg/dL   HDL 66 >39 mg/dL   VLDL Cholesterol Cal 13 5 - 40 mg/dL   LDL Chol Calc (NIH) 114 (H)  0 - 99 mg/dL   LDL/HDL Ratio 1.7 0.0 - 3.6 ratio    Comment:                                     LDL/HDL Ratio  Men  Women                               1/2 Avg.Risk  1.0    1.5                                   Avg.Risk  3.6    3.2                                2X Avg.Risk  6.2    5.0                                3X Avg.Risk  8.0    6.1   HCV Ab w Reflex to Quant PCR     Status: None   Collection Time: 01/10/20  8:16 AM  Result Value Ref Range   HCV Ab <0.1 0.0 - 0.9 s/co ratio  T4, free     Status: None   Collection Time: 01/10/20  8:16 AM  Result Value Ref Range   Free T4 1.56 0.82 - 1.77 ng/dL  TSH     Status: None   Collection Time: 01/10/20  8:16 AM  Result Value Ref Range   TSH 2.090 0.450 - 4.500 uIU/mL  PSA     Status: None   Collection Time: 01/10/20  8:16 AM  Result Value Ref Range   Prostate Specific Ag, Serum 0.4 0.0 - 4.0 ng/mL    Comment: Roche ECLIA methodology. According to the American Urological Association, Serum PSA should decrease and remain at undetectable levels after radical prostatectomy. The AUA defines biochemical recurrence as an initial PSA value 0.2 ng/mL or greater followed by a subsequent confirmatory PSA value 0.2 ng/mL or greater. Values obtained with different assay methods or kits cannot be used interchangeably. Results cannot be interpreted as absolute evidence of the presence or absence of malignant disease.   Interpretation:     Status: None   Collection Time: 01/10/20  8:16 AM  Result Value Ref Range   HCV Interp 1: Comment     Comment: Negative Not infected with HCV, unless recent infection is suspected or other evidence exists to indicate HCV infection.     Radiology: DG Chest 2 View  Result Date: 05/23/2017 CLINICAL DATA:  Shortness of Breath EXAM: CHEST - 2 VIEW COMPARISON:  07/01/2004 FINDINGS: Coarsened interstitial opacities throughout the lungs, likely severe chronic  interstitial lung disease. Hyperinflation/COPD. Nodular areas in both mid lungs may represent a component of the interstitial disease but I cannot exclude other pulmonary nodules. No effusions. Heart is normal size. IMPRESSION: Chronic interstitial lung disease, COPD. Nodular densities in both mid lungs. These could be further evaluated with noncontrast chest CT. Electronically Signed   By: Rolm Baptise M.D.   On: 05/23/2017 22:26   CT CHEST WO CONTRAST  Result Date: 05/24/2017 CLINICAL DATA:  Productive cough on antibiotics four days. EXAM: CT CHEST WITHOUT CONTRAST TECHNIQUE: Multidetector CT imaging of the chest was performed following the standard protocol without IV contrast. COMPARISON:  Chest x-ray 05/23/2017 FINDINGS: Cardiovascular: Heart is normal size. Calcified plaque over the left main and 3 vessel coronary arteries. Minimal calcified plaque over the thoracic aorta. Mediastinum/Nodes: No significant hilar or mediastinal adenopathy. Remaining mediastinal  structures are unremarkable. Lungs/Pleura: Lungs are adequately inflated demonstrate moderate centrilobular emphysema. There is a patchy bilateral reticulonodular pattern of opacification. No evidence of effusion. Airways are within normal. Upper Abdomen: No acute abnormality. Mild calcified plaque over the abdominal aorta. Nonspecific 1 cm right retrocrural lymph node. Musculoskeletal: Mild degenerate changes spine with several Schmorl's nodes. IMPRESSION: Bilateral patchy reticulonodular pattern of opacification. Findings are likely due to an infectious or atypical infectious process such as mycobacterium avium complex or an inflammatory process. Recommend follow-up CT 4-6 weeks. Atherosclerotic coronary artery disease. Aortic Atherosclerosis (ICD10-I70.0). Electronically Signed   By: Marin Olp M.D.   On: 05/24/2017 14:03    No results found.  No results found.    Assessment and Plan: Patient Active Problem List   Diagnosis Date  Noted  . Chronic obstructive pulmonary disease ([place]) 01/11/2020  . Dyspnea on exertion 01/11/2020  . Screening for lung cancer 01/11/2020  . Moderate cigarette smoker 01/11/2020  . Coronary artery disease involving native coronary artery of native heart without angina pectoris 01/11/2020  . Aortic atherosclerosis ([place]) 01/11/2020  . Generalized anxiety disorder 01/11/2020  . Other fatigue 01/11/2020  . Screening for colon cancer 01/11/2020  . Respiratory failure with hypoxia ([place]) 05/24/2017    1. Chronic obstructive pulmonary disease, unspecified COPD type ([place]) We will continue with medical management.  Inhalers were reviewed and will be giving her Trelegy 1 puff daily. - Fluticasone-Umeclidin-Vilant (TRELEGY ELLIPTA) 100-62.5-25 MCG/INH AEPB; Inhale 1 puff into the lungs daily.  Dispense: 1 each; Refill: 4  2. Shortness of breath Due to the COPD as well as probable underlying MAI.  Need to be concerned about ongoing destructive lung disease from ongoing infection.  3. Cigarette nicotine dependence with nicotine-induced disorder Need to stop smoking counseling provided once again for smoking cessation.  4. 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 - CT Chest High Resolution; Future  5. Interstitial pulmonary disease ([place]) As above CT scan of the chest will be done likely will be finding findings of MAI infection. - CT Chest High Resolution; Future   General Counseling: I have discussed the findings of the evaluation and examination with Annie Main.  I have also discussed any further diagnostic evaluation thatmay be needed or ordered today. Regginald verbalizes understanding of the findings of todays visit. We also reviewed his medications today and discussed drug interactions and side effects including but not limited excessive drowsiness and altered mental states.  We also discussed that there is always a risk not just to him but also people around him. he has been encouraged to call the office with any questions or concerns that should arise related to todays visit.  No orders of the defined types were placed in this encounter.    Time spent: 69  I have personally obtained a history, examined the patient, evaluated laboratory and imaging results, formulated the assessment and plan and placed orders.    Allyne Gee, MD [facility] Pulmonary and Critical Care Sleep medicine

## 2020-03-26 ENCOUNTER — Other Ambulatory Visit: Payer: Self-pay | Admitting: Hospice and Palliative Medicine

## 2020-03-26 ENCOUNTER — Telehealth: Payer: Self-pay | Admitting: *Deleted

## 2020-03-26 NOTE — Telephone Encounter (Signed)
Received referral for low dose lung cancer screening CT scan. Message left at phone number listed in EMR for patient to call me back to facilitate scheduling scan.  

## 2020-04-04 ENCOUNTER — Telehealth: Payer: Self-pay

## 2020-04-04 NOTE — Telephone Encounter (Signed)
Lmom advising patient of appointment for CT on 04/19/20 arrival 3:15pm.

## 2020-04-19 ENCOUNTER — Other Ambulatory Visit: Payer: Self-pay

## 2020-04-19 ENCOUNTER — Ambulatory Visit: Payer: BLUE CROSS/BLUE SHIELD

## 2020-04-24 ENCOUNTER — Ambulatory Visit: Payer: BLUE CROSS/BLUE SHIELD | Admitting: Internal Medicine

## 2020-05-01 ENCOUNTER — Telehealth: Payer: Self-pay

## 2020-05-01 NOTE — Telephone Encounter (Signed)
Spoke with patient this morning to see if had scheduled appointment with [facility] gastroenterolgy. He stated he had not. I gave him their telephone #. He will call them to schedule appointment, then call us back with appointment date, time and name of provider.-Toni

## 2020-05-16 ENCOUNTER — Other Ambulatory Visit: Payer: Self-pay | Admitting: Physician Assistant

## 2020-05-16 DIAGNOSIS — F411 Generalized anxiety disorder: Secondary | ICD-10-CM

## 2020-05-24 ENCOUNTER — Other Ambulatory Visit: Payer: Self-pay

## 2020-05-24 ENCOUNTER — Encounter: Payer: Self-pay | Admitting: Physician Assistant

## 2020-05-24 ENCOUNTER — Ambulatory Visit: Payer: PPO | Admitting: Physician Assistant

## 2020-05-24 DIAGNOSIS — I700 Atherosclerosis of aorta: Secondary | ICD-10-CM

## 2020-05-24 DIAGNOSIS — F411 Generalized anxiety disorder: Secondary | ICD-10-CM

## 2020-05-24 DIAGNOSIS — I6523 Occlusion and stenosis of bilateral carotid arteries: Secondary | ICD-10-CM

## 2020-05-24 DIAGNOSIS — F17219 Nicotine dependence, cigarettes, with unspecified nicotine-induced disorders: Secondary | ICD-10-CM | POA: Diagnosis not present

## 2020-05-24 DIAGNOSIS — E7800 Pure hypercholesterolemia, unspecified: Secondary | ICD-10-CM

## 2020-05-24 DIAGNOSIS — J449 Chronic obstructive pulmonary disease, unspecified: Secondary | ICD-10-CM | POA: Diagnosis not present

## 2020-05-24 NOTE — Progress Notes (Signed)
[facility] [address] [place], [place] [address]  Internal MEDICINE  Office Visit Note  Patient Name: Chase Burnett  [id number]  [id number]  Date of Service: 05/25/2020  Chief Complaint  Patient presents with  . Follow-up    GI referral, CT referral,   . COPD    HPI Pt is here for routine follow up and has no complaints today. -Doing much better on trelegy, only using albuterol some days now.  -Smoking less than 1/2ppd now really working on cutting down.  -patient is getting an appt at [facility] for colonoscopy. Missed voicemaill from them and are arranging now. -CT chest is also being scheduled through [facility] for early August with pulmonary f/u after -Held off starting statin for a bit after reading up on side effects, but did start it about a month ago and is tolerating well. -Trazodone is working well, taking a full tab now.  -Planned vacation end of July   Current Medication: Outpatient Encounter Medications as of 05/24/2020  Medication Sig  . atorvastatin (LIPITOR) 10 MG tablet Take 1 tablet (10 mg total) by mouth daily.  . Fluticasone-Umeclidin-Vilant (TRELEGY ELLIPTA) 100-62.5-25 MCG/INH AEPB Inhale 1 puff into the lungs daily.  . traZODone (DESYREL) 50 MG tablet TAKE 1/2 TO 1 TABLET(25 TO 50 MG) BY MOUTH AT BEDTIME AS NEEDED FOR SLEEP  . albuterol (VENTOLIN HFA) 108 (90 Base) MCG/ACT inhaler Inhale 2 puffs into the lungs every 6 (six) hours as needed for wheezing or shortness of breath.  . Budeson-Glycopyrrol-Formoterol (BREZTRI AEROSPHERE) 160-9-4.8 MCG/ACT AERO Inhale 2 puffs into the lungs 2 (two) times daily. (Patient not taking: Reported on 05/24/2020)   No facility-administered encounter medications on file as of 05/24/2020.    Surgical History: Past Surgical History:  Procedure Laterality Date  . CHEST TUBE INSERTION      Medical History: Past Medical History:  Diagnosis Date  . COPD (chronic obstructive pulmonary disease) (HCC)   .  Pneumothorax     Family History: Family History  Problem Relation Age of Onset  . Cancer Mother   . Anxiety disorder Brother   . COPD Brother     Social History   Socioeconomic History  . Marital status: Married    Spouse name: Not on file  . Number of children: Not on file  . Years of education: Not on file  . Highest education level: Not on file  Occupational History  . Not on file  Tobacco Use  . Smoking status: Current Every Day Smoker    Packs/day: 1.00    Years: 40.00    Pack years: 40.00    Types: Cigarettes  . Smokeless tobacco: Never Used  Substance and Sexual Activity  . Alcohol use: Not Currently  . Drug use: Never  . Sexual activity: Not on file  Other Topics Concern  . Not on file  Social History Narrative  . Not on file   Social Determinants of Health   Financial Resource Strain: Not on file  Food Insecurity: Not on file  Transportation Needs: Not on file  Physical Activity: Not on file  Stress: Not on file  Social Connections: Not on file  Intimate Partner Violence: Not on file      Review of Systems  Constitutional: Negative for chills, fatigue and unexpected weight change.  HENT: Negative for congestion, postnasal drip, rhinorrhea, sneezing and sore throat.   Eyes: Negative for redness.  Respiratory: Negative for cough, chest tightness, shortness of breath and wheezing.  Cardiovascular: Negative for chest pain and palpitations.  Gastrointestinal: Negative for abdominal pain, constipation, diarrhea, nausea and vomiting.  Genitourinary: Negative for dysuria and frequency.  Musculoskeletal: Negative for arthralgias, back pain, joint swelling and neck pain.  Skin: Negative for rash.  Neurological: Negative.  Negative for tremors and numbness.  Hematological: Negative for adenopathy. Does not bruise/bleed easily.  Psychiatric/Behavioral: Positive for sleep disturbance. Negative for behavioral problems (Depression) and suicidal ideas. The  patient is not nervous/anxious.     Vital Signs: BP 128/74   Pulse 94   Temp 98.9 F (37.2 C)   Resp 16   Ht 5\' 9"  (1.753 m)   Wt 149 lb (67.6 kg)   SpO2 96%   BMI 22.00 kg/m    Physical Exam Vitals and nursing note reviewed.  Constitutional:      General: He is not in acute distress.    Appearance: He is well-developed and normal weight. He is not diaphoretic.  HENT:     Head: Normocephalic and atraumatic.     Mouth/Throat:     Pharynx: No oropharyngeal exudate.  Eyes:     Pupils: Pupils are equal, round, and reactive to light.  Neck:     Thyroid: No thyromegaly.     Vascular: No JVD.     Trachea: No tracheal deviation.  Cardiovascular:     Rate and Rhythm: Normal rate and regular rhythm.     Heart sounds: Normal heart sounds. No murmur heard. No friction rub. No gallop.   Pulmonary:     Effort: Pulmonary effort is normal. No respiratory distress.     Breath sounds: Wheezing present. No rales.  Chest:     Chest wall: No tenderness.  Abdominal:     General: Bowel sounds are normal.     Palpations: Abdomen is soft.  Musculoskeletal:        General: Normal range of motion.     Cervical back: Normal range of motion and neck supple.  Lymphadenopathy:     Cervical: No cervical adenopathy.  Skin:    General: Skin is warm and dry.  Neurological:     Mental Status: He is alert and oriented to person, place, and time.     Cranial Nerves: No cranial nerve deficit.  Psychiatric:        Behavior: Behavior normal.        Thought Content: Thought content normal.        Judgment: Judgment normal.        Assessment/Plan: 1. Chronic obstructive pulmonary disease, unspecified COPD type (HCC) Stable, continue Trelegy. Has Ct chest scheduled in August and will follow up with pulm after  2. Hypercholesteremia Continue Lipitor  3. Aortic atherosclerosis (HCC) Continue Lipitor  4. Atherosclerosis of both carotid arteries Continue Lipitor  5. Generalized anxiety  disorder Continue trazodone at night  6. Cigarette nicotine dependence with nicotine-induced disorder Smoking cessation counseling: 1. Pt acknowledges the risks of long term smoking, she will try to quite smoking. 2. Options for different medications including nicotine products, chewing gum, patch etc, Wellbutrin and Chantix is discussed 3. Goal and date of compete cessation is discussed 4. Total time spent in smoking cessation is 10 min.    General Counseling: kentavius dettore understanding of the findings of todays visit and agrees with plan of treatment. I have discussed any further diagnostic evaluation that may be needed or ordered today. We also reviewed his medications today. he has been encouraged to call the office with any questions or concerns  that should arise related to todays visit.    No orders of the defined types were placed in this encounter.   No orders of the defined types were placed in this encounter.   This patient was seen by Lynn Ito, PA-C in collaboration with Dr. Beverely Risen as a part of collaborative care agreement.   Total time spent:30 Minutes Time spent includes review of chart, medications, test results, and follow up plan with the patient.      Dr Lyndon Code Internal medicine

## 2020-06-11 ENCOUNTER — Telehealth: Payer: Self-pay

## 2020-06-11 NOTE — Telephone Encounter (Signed)
Patient to call me back w/ [facility] appointment date & time-Toni

## 2020-07-03 ENCOUNTER — Telehealth: Payer: Self-pay

## 2020-07-03 NOTE — Telephone Encounter (Signed)
Patient scheduled with [facility] on 09-12-20 at 12:45p. Toni Amend

## 2020-07-06 ENCOUNTER — Telehealth: Payer: Self-pay

## 2020-07-06 NOTE — Telephone Encounter (Signed)
Pt given samples of Trelegy 100-62.5  pt wasn't able to get his prescription

## 2020-07-22 ENCOUNTER — Other Ambulatory Visit: Payer: Self-pay | Admitting: Physician Assistant

## 2020-07-22 DIAGNOSIS — F411 Generalized anxiety disorder: Secondary | ICD-10-CM

## 2020-08-09 ENCOUNTER — Encounter: Payer: Self-pay | Admitting: Internal Medicine

## 2020-08-16 ENCOUNTER — Other Ambulatory Visit: Payer: Self-pay

## 2020-08-16 ENCOUNTER — Encounter: Payer: Self-pay | Admitting: Internal Medicine

## 2020-08-16 ENCOUNTER — Ambulatory Visit: Payer: PPO | Admitting: Internal Medicine

## 2020-08-16 VITALS — BP 128/66 | HR 84 | Temp 98.5°F | Resp 16 | Ht 69.0 in | Wt 140.6 lb

## 2020-08-16 DIAGNOSIS — A310 Pulmonary mycobacterial infection: Secondary | ICD-10-CM

## 2020-08-16 DIAGNOSIS — J849 Interstitial pulmonary disease, unspecified: Secondary | ICD-10-CM

## 2020-08-16 DIAGNOSIS — J449 Chronic obstructive pulmonary disease, unspecified: Secondary | ICD-10-CM | POA: Diagnosis not present

## 2020-08-16 DIAGNOSIS — F17219 Nicotine dependence, cigarettes, with unspecified nicotine-induced disorders: Secondary | ICD-10-CM

## 2020-08-16 MED ORDER — TRELEGY ELLIPTA 100-62.5-25 MCG/INH IN AEPB: 1.0000 | INHALATION_SPRAY | Freq: Every day | RESPIRATORY_TRACT | 4 refills | Status: DC

## 2020-08-16 NOTE — Patient Instructions (Signed)
Chronic Obstructive Pulmonary Disease Chronic obstructive pulmonary disease (COPD) is a long-term (chronic) lung problem. When you have COPD, it is hard for air to get in and out of your lungs. Usually the condition gets worse over time, and your lungs will never return to normal. There are things you can do to keep yourself as healthy as possible. What are the causes? Smoking. This is the most common cause. Certain genes passed from parent to child (inherited). What increases the risk? Being exposed to secondhand smoke from cigarettes, pipes, or cigars. Being exposed to chemicals and other irritants, such as fumes and dust in the work environment. Having chronic lung conditions or infections. What are the signs or symptoms? Shortness of breath, especially during physical activity. A long-term cough with a large amount of thick mucus. Sometimes, the cough may not have any mucus (dry cough). Wheezing. Breathing quickly. Skin that looks gray or blue, especially in the fingers, toes, or lips. Feeling tired (fatigue). Weight loss. Chest tightness. Having infections often. Episodes when breathing symptoms become much worse (exacerbations). At the later stages of this disease, you may have swelling in the ankles, feet, or legs. How is this treated? Taking medicines. Quitting smoking, if you smoke. Rehabilitation. This includes steps to make your body work better. It may involve a team of specialists. Doing exercises. Making changes to your diet. Using oxygen. Lung surgery. Lung transplant. Comfort measures (palliative care). Follow these instructions at home: Medicines Take over-the-counter and prescription medicines only as told by your doctor. Talk to your doctor before taking any cough or allergy medicines. You may need to avoid medicines that cause your lungs to be dry. Lifestyle If you smoke, stop smoking. Smoking makes the problem worse. Do not smoke or use any products that  contain nicotine or tobacco. If you need help quitting, ask your doctor. Avoid being around things that make your breathing worse. This may include smoke, chemicals, and fumes. Stay active, but remember to rest as well. Learn and use tips on how to manage stress and control your breathing. Make sure you get enough sleep. Most adults need at least 7 hours of sleep every night. Eat healthy foods. Eat smaller meals more often. Rest before meals. Controlled breathing Learn and use tips on how to control your breathing as told by your doctor. Try: Breathing in (inhaling) through your nose for 1 second. Then, pucker your lips and breath out (exhale) through your lips for 2 seconds. Putting one hand on your belly (abdomen). Breathe in slowly through your nose for 1 second. Your hand on your belly should move out. Pucker your lips and breathe out slowly through your lips. Your hand on your belly should move in as you breathe out.  Controlled coughing Learn and use controlled coughing to clear mucus from your lungs. Follow these steps: Lean your head a little forward. Breathe in deeply. Try to hold your breath for 3 seconds. Keep your mouth slightly open while coughing 2 times. Spit any mucus out into a tissue. Rest and do the steps again 1 or 2 times as needed. General instructions Make sure you get all the shots (vaccines) that your doctor recommends. Ask your doctor about a flu shot and a pneumonia shot. Use oxygen therapy and pulmonary rehabilitation if told by your doctor. If you need home oxygen therapy, ask your doctor if you should buy a tool to measure your oxygen level (oximeter). Make a COPD action plan with your doctor. This helps you to   know what to do if you feel worse than usual. Manage any other conditions you have as told by your doctor. Avoid going outside when it is very hot, cold, or humid. Avoid people who have a sickness you can catch (contagious). Keep all follow-up  visits. Contact a doctor if: You cough up more mucus than usual. There is a change in the color or thickness of the mucus. It is harder to breathe than usual. Your breathing is faster than usual. You have trouble sleeping. You need to use your medicines more often than usual. You have trouble doing your normal activities such as getting dressed or walking around the house. Get help right away if: You have shortness of breath while resting. You have shortness of breath that stops you from: Being able to talk. Doing normal activities. Your chest hurts for longer than 5 minutes. Your skin color is more blue than usual. Your pulse oximeter shows that you have low oxygen for longer than 5 minutes. You have a fever. You feel too tired to breathe normally. These symptoms may represent a serious problem that is an emergency. Do not wait to see if the symptoms will go away. Get medical help right away. Call your local emergency services (911 in the [place] Do not drive yourself to the hospital. Summary Chronic obstructive pulmonary disease (COPD) is a long-term lung problem. The way your lungs work will never return to normal. Usually the condition gets worse over time. There are things you can do to keep yourself as healthy as possible. Take over-the-counter and prescription medicines only as told by your doctor. If you smoke, stop. Smoking makes the problem worse. This information is not intended to replace advice given to you by your health care provider. Make sure you discuss any questions you have with your health care provider. Document Revised: 11/01/2019 Document Reviewed: 11/01/2019 Elsevier Patient Education  2022 Else[employer] Inc[employer] Steps to Quit Smoking Smoking tobacco is the leading cause of preventable death. It can affect almost every organ in the body. Smoking puts you and people around you at risk for many serious, long-lasting (chronic) diseases. Quitting smoking can be hard, but it  is one of the best things that you can do for your health. It is never too late to quit. How do I get ready to quit? When you decide to quit smoking, make a plan to help you succeed. Before you quit: Pick a date to quit. Set a date within the next 2 weeks to give you time to prepare. Write down the reasons why you are quitting. Keep this list in places where you will see it often. Tell your family, friends, and co-workers that you are quitting. Their support is important. Talk with your doctor about the choices that may help you quit. Find out if your health insurance will pay for these treatments. Know the people, places, things, and activities that make you want to smoke (triggers). Avoid them. What first steps can I take to quit smoking? Throw away all cigarettes at home, at work, and in your car. Throw away the things that you use when you smoke, such as ashtrays and lighters. Clean your car. Make sure to empty the ashtray. Clean your home, including curtains and carpets. What can I do to help me quit smoking? Talk with your doctor about taking medicines and seeing a counselor at the same time. You are more likely to succeed when you do both. If you are pregnant or breastfeeding, talk   with your doctor about counseling or other ways to quit smoking. Do not take medicine to help you quit smoking unless your doctor tells you to do so. To quit smoking: Quit right away Quit smoking totally, instead of slowly cutting back on how much you smoke over a period of time. Go to counseling. You are more likely to quit if you go to counseling sessions regularly. Take medicine You may take medicines to help you quit. Some medicines need a prescription, and some you can buy over-the-counter. Some medicines may contain a drug called nicotine to replace the nicotine in cigarettes. Medicines may: Help you to stop having the desire to smoke (cravings). Help to stop the problems that come when you stop  smoking (withdrawal symptoms). Your doctor may ask you to use: Nicotine patches, gum, or lozenges. Nicotine inhalers or sprays. Non-nicotine medicine that is taken by mouth. Find resources Find resources and other ways to help you quit smoking and remain smoke-free after you quit. These resources are most helpful when you use them often. They include: Online chats with a counselor. Phone quitlines. Printed self-help materials. Support groups or group counseling. Text messaging programs. Mobile phone apps. Use apps on your mobile phone or tablet that can help you stick to your quit plan. There are many free apps for mobile phones and tablets as well as websites. Examples include Quit Guide from the CDC and smokefree.gov  What things can I do to make it easier to quit?  Talk to your family and friends. Ask them to support and encourage you. Call a phone quitline (1-800-QUIT-NOW), reach out to support groups, or work with a counselor. Ask people who smoke to not smoke around you. Avoid places that make you want to smoke, such as: Bars. Parties. Smoke-break areas at work. Spend time with people who do not smoke. Lower the stress in your life. Stress can make you want to smoke. Try these things to help your stress: Getting regular exercise. Doing deep-breathing exercises. Doing yoga. Meditating. Doing a body scan. To do this, close your eyes, focus on one area of your body at a time from head to toe. Notice which parts of your body are tense. Try to relax the muscles in those areas. How will I feel when I quit smoking? Day 1 to 3 weeks Within the first 24 hours, you may start to have some problems that come from quitting tobacco. These problems are very bad 2-3 days after you quit, but they do not often last for more than 2-3 weeks. You may get these symptoms: Mood swings. Feeling restless, nervous, angry, or annoyed. Trouble concentrating. Dizziness. Strong desire for high-sugar  foods and nicotine. Weight gain. Trouble pooping (constipation). Feeling like you may vomit (nausea). Coughing or a sore throat. Changes in how the medicines that you take for other issues work in your body. Depression. Trouble sleeping (insomnia). Week 3 and afterward After the first 2-3 weeks of quitting, you may start to notice more positive results, such as: Better sense of smell and taste. Less coughing and sore throat. Slower heart rate. Lower blood pressure. Clearer skin. Better breathing. Fewer sick days. Quitting smoking can be hard. Do not give up if you fail the first time. Some people need to try a few times before they succeed. Do your best to stick to your quit plan, and talk with your doctor if you have any questions or concerns. Summary Smoking tobacco is the leading cause of preventable death. Quitting smoking   can be hard, but it is one of the best things that you can do for your health. When you decide to quit smoking, make a plan to help you succeed. Quit smoking right away, not slowly over a period of time. When you start quitting, seek help from your doctor, family, or friends. This information is not intended to replace advice given to you by your health care provider. Make sure you discuss any questions you have with your health care provider. Document Revised: 09/17/2018 Document Reviewed: 03/13/2018 Elsevier Patient Education  2022 [employer]vier Inc.  

## 2020-08-16 NOTE — Progress Notes (Signed)
[facility] [place], [place] [address]  Pulmonary Sleep Medicine   Office Visit Note  Patient Name: Chase Burnett DOB: Nov 04, 1955 MRN [id number]  Date of Service: 08/16/2020  Complaints/HPI: COPD on trelegy. He states he has had a remarkable improvement in his symptoms. Patient states had has less SOB. Unfortunately he is still smoking. He is trying to quit but has not been successful. No cough noted no sputum and no hemoptysis. Denies having CP at this time  ROS  General: (-) fever, (-) chills, (-) night sweats, (-) weakness Skin: (-) rashes, (-) itching,. Eyes: (-) visual changes, (-) redness, (-) itching. Nose and Sinuses: (-) nasal stuffiness or itchiness, (-) postnasal drip, (-) nosebleeds, (-) sinus trouble. Mouth and Throat: (-) sore throat, (-) hoarseness. Neck: (-) swollen glands, (-) enlarged thyroid, (-) neck pain. Respiratory: - cough, (-) bloody sputum, - shortness of breath, - wheezing. Cardiovascular: - ankle swelling, (-) chest pain. Lymphatic: (-) lymph node enlargement. Neurologic: (-) numbness, (-) tingling. Psychiatric: (-) anxiety, (-) depression   Current Medication: Outpatient Encounter Medications as of 08/16/2020  Medication Sig   albuterol (VENTOLIN HFA) 108 (90 Base) MCG/ACT inhaler Inhale 2 puffs into the lungs every 6 (six) hours as needed for wheezing or shortness of breath.   atorvastatin (LIPITOR) 10 MG tablet Take 1 tablet (10 mg total) by mouth daily.   Fluticasone-Umeclidin-Vilant (TRELEGY ELLIPTA) 100-62.5-25 MCG/INH AEPB Inhale 1 puff into the lungs daily.   traZODone (DESYREL) 50 MG tablet Take one tab at bedtime for sleep   [DISCONTINUED] Budeson-Glycopyrrol-Formoterol (BREZTRI AEROSPHERE) 160-9-4.8 MCG/ACT AERO Inhale 2 puffs into the lungs 2 (two) times daily. (Patient not taking: No sig reported)   No facility-administered encounter medications on file as of 08/16/2020.    Surgical History: Past Surgical  History:  Procedure Laterality Date   CHEST TUBE INSERTION      Medical History: Past Medical History:  Diagnosis Date   COPD (chronic obstructive pulmonary disease) ([place])    Pneumothorax     Family History: Family History  Problem Relation Age of Onset   Cancer Mother    Anxiety disorder Brother    COPD Brother     Social History: Social History   Socioeconomic History   Marital status: Married    Spouse name: Not on file   Number of children: Not on file   Years of education: Not on file   Highest education level: Not on file  Occupational History   Not on file  Tobacco Use   Smoking status: Every Day    Packs/day: 0.50    Years: 40.00    Pack years: 20.00    Types: Cigarettes   Smokeless tobacco: Never   Tobacco comments:    Pt down to 1/2 pk or less daily 08/16/20  Substance and Sexual Activity   Alcohol use: Not Currently   Drug use: Never   Sexual activity: Not on file  Other Topics Concern   Not on file  Social History Narrative   Not on file   Social Determinants of Health   Financial Resource Strain: Not on file  Food Insecurity: Not on file  Transportation Needs: Not on file  Physical Activity: Not on file  Stress: Not on file  Social Connections: Not on file  Intimate Partner Violence: Not on file    Vital Signs: Blood pressure 128/66, pulse 84, temperature 98.5 F (36.9 C), resp. rate 16, height _0  (1.753 m), weight 140 lb 9.6 oz (  63.8 kg), SpO2 96 %.  Examination: General Appearance: The patient is well-developed, well-nourished, and in no distress. Skin: Gross inspection of skin unremarkable. Head: normocephalic, no gross deformities. Eyes: no gross deformities noted. ENT: ears appear grossly normal no exudates. Neck: Supple. No thyromegaly. No LAD. Respiratory: no rhonchi noted. Cardiovascular: Normal S1 and S2 without murmur or rub. Extremities: No cyanosis. pulses are equal. Neurologic: Alert and oriented. No involuntary  movements.  LABS: No results found for this or any previous visit (from the past 2160 hour(s)).  Radiology: DG Chest 2 View  Result Date: 05/23/2017 CLINICAL DATA:  Shortness of Breath EXAM: CHEST - 2 VIEW COMPARISON:  07/01/2004 FINDINGS: Coarsened interstitial opacities throughout the lungs, likely severe chronic interstitial lung disease. Hyperinflation/COPD. Nodular areas in both mid lungs may represent a component of the interstitial disease but I cannot exclude other pulmonary nodules. No effusions. Heart is normal size. IMPRESSION: Chronic interstitial lung disease, COPD. Nodular densities in both mid lungs. These could be further evaluated with noncontrast chest CT. Electronically Signed   By: Rolm Baptise M.D.   On: 05/23/2017 22:26   CT CHEST WO CONTRAST  Result Date: 05/24/2017 CLINICAL DATA:  Productive cough on antibiotics four days. EXAM: CT CHEST WITHOUT CONTRAST TECHNIQUE: Multidetector CT imaging of the chest was performed following the standard protocol without IV contrast. COMPARISON:  Chest x-ray 05/23/2017 FINDINGS: Cardiovascular: Heart is normal size. Calcified plaque over the left Burnett and 3 vessel coronary arteries. Minimal calcified plaque over the thoracic aorta. Mediastinum/Nodes: No significant hilar or mediastinal adenopathy. Remaining mediastinal structures are unremarkable. Lungs/Pleura: Lungs are adequately inflated demonstrate moderate centrilobular emphysema. There is a patchy bilateral reticulonodular pattern of opacification. No evidence of effusion. Airways are within normal. Upper Abdomen: No acute abnormality. Mild calcified plaque over the abdominal aorta. Nonspecific 1 cm right retrocrural lymph node. Musculoskeletal: Mild degenerate changes spine with several Schmorl's nodes. IMPRESSION: Bilateral patchy reticulonodular pattern of opacification. Findings are likely due to an infectious or atypical infectious process such as mycobacterium avium complex or an  inflammatory process. Recommend follow-up CT 4-6 weeks. Atherosclerotic coronary artery disease. Aortic Atherosclerosis (ICD10-I70.0). Electronically Signed   By: Marin Olp M.D.   On: 05/24/2017 14:03    No results found.  No results found.  Chase Lines, MD - 08/14/2020  Formatting of this note might be different from the original.  EXAM: CT Chest High Resolution without contrast  DATE: 08/14/2020 9:12 AM  ACCESSION: [id number]  DICTATED: 08/14/2020 9:21 AM  INTERPRETATION LOCATION: [place]   CLINICAL INDICATION: 65 year old male with J84.9 - ILD (interstitial lung disease) (CMS - [place]).     COMPARISON: None.   TECHNIQUE: Noncontrast CT Chest was performed.  Contiguous 0.6 and 70m axial images were reconstructed through the chest following a single breath hold helical acquisition.  Images were reformatted in the sagittal and coronal planes.  Expiratory and prone images were obtained following additional breath holds.   FINDINGS:   LUNGS AND AIRWAYS:  There is extensive coalescent centrilobular emphysema with paraseptal emphysema and bulla formation in the apices. There is no interstitial lung disease or bronchiectasis. The lungs are otherwise clear. The trachea has a narrow, saber-sheath configuration typical of COPD. Subjective thickening of the central airways indicates inflammation likely chronic.The central airways are patent..  Expiratory scans show mild-to-moderate air trapping indicating underlying obstructive small airways disease.   PLEURA: There is no pleural effusion.   MEDIASTINUM AND LYMPH NODES: No enlarged intrathoracic or axillary lymph nodes.  HEART AND VASCULATURE: Cardiac chambers are normal in size.  No pericardial effusion.  Normal caliber ascending and descending aorta.  Pulmonary artery normal in size. Coronary calcification.   BONES AND SOFT TISSUES: Unremarkable.   UPPER ABDOMEN: Unremarkable.    IMPRESSION:   Significant emphysema with  features of COPD. No interstitial lung disease or bronchiectasis.   Coronary artery disease. Exam End: 08/14/20 09:12   Specimen Collected: 08/14/20 09:21 Last Resulted: 08/14/20 10:03  Received From: [facility]  Result Received: 08/15/20 08:36    Assessment and Plan: Patient Active Problem List   Diagnosis Date Noted   Chronic obstructive pulmonary disease ([place]) 01/11/2020   Dyspnea on exertion 01/11/2020   Screening for lung cancer 01/11/2020   Moderate cigarette smoker 01/11/2020   Coronary artery disease involving native coronary artery of native heart without angina pectoris 01/11/2020   Aortic atherosclerosis ([place]) 01/11/2020   Generalized anxiety disorder 01/11/2020   Other fatigue 01/11/2020   Screening for colon cancer 01/11/2020   Respiratory failure with hypoxia ([place]) 05/24/2017    1. Chronic obstructive pulmonary disease, unspecified COPD type ([place]) On trelegy will be continued  2. Cigarette nicotine dependence with nicotine-induced disorder Smoking cessation strongly recommended patient unfortunately continues to use cigarettes  3. 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 Interstitial pulmonary disease ([place]) According to CT scan no ILD is noted I have personally reviewed the CT scan results with the patient during this visit  General Counseling: I have discussed the findings of the evaluation and examination with Chase Burnett.  I have also discussed any further diagnostic evaluation thatmay be needed or ordered today. Chase Burnett verbalizes understanding of the findings of todays visit. We also reviewed his medications today and discussed drug interactions and side effects including but not limited excessive drowsiness and altered mental states. We also discussed that there is always a risk not just to him but also people around him. he has been  encouraged to call the office with any questions or concerns that should arise related to todays visit.  No orders of the defined types were placed in this encounter.    Time spent: 46  I have personally obtained a history, examined the patient, evaluated laboratory and imaging results, formulated the assessment and plan and placed orders.    Chase Gee, MD [facility] Pulmonary and Critical Care Sleep medicine

## 2020-08-23 ENCOUNTER — Ambulatory Visit (INDEPENDENT_AMBULATORY_CARE_PROVIDER_SITE_OTHER): Payer: PPO | Admitting: Physician Assistant

## 2020-08-23 ENCOUNTER — Other Ambulatory Visit: Payer: Self-pay

## 2020-08-23 ENCOUNTER — Encounter: Payer: Self-pay | Admitting: Physician Assistant

## 2020-08-23 DIAGNOSIS — J449 Chronic obstructive pulmonary disease, unspecified: Secondary | ICD-10-CM | POA: Diagnosis not present

## 2020-08-23 DIAGNOSIS — F17219 Nicotine dependence, cigarettes, with unspecified nicotine-induced disorders: Secondary | ICD-10-CM

## 2020-08-23 DIAGNOSIS — I6523 Occlusion and stenosis of bilateral carotid arteries: Secondary | ICD-10-CM

## 2020-08-23 DIAGNOSIS — N529 Male erectile dysfunction, unspecified: Secondary | ICD-10-CM

## 2020-08-23 DIAGNOSIS — E7800 Pure hypercholesterolemia, unspecified: Secondary | ICD-10-CM | POA: Diagnosis not present

## 2020-08-23 DIAGNOSIS — I700 Atherosclerosis of aorta: Secondary | ICD-10-CM

## 2020-08-23 MED ORDER — ATORVASTATIN CALCIUM 10 MG PO TABS: 10.0000 mg | ORAL_TABLET | Freq: Every day | ORAL | 3 refills | Status: DC

## 2020-08-23 MED ORDER — TADALAFIL 5 MG PO TABS: 5.0000 mg | ORAL_TABLET | Freq: Every day | ORAL | 0 refills | Status: DC

## 2020-08-23 NOTE — Progress Notes (Signed)
[facility] [address] [place], [place] [address]  Internal MEDICINE  Office Visit Note  Patient Name: Chase Burnett  [id number]  [id number]  Date of Service: 08/28/2020  Chief Complaint  Patient presents with   Follow-up   Hyperlipidemia   COPD    HPI Pt is here for routine follow up -Has cut back on smoking further but does still smoke less than half pack per day. Stopped bringing them to work which has helped. 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 He is doing sit ups, push ups, light weights, walking.  -takes full tab trazodone nightly and has been sleeping well -Just had pulmonary follow  up and everything has been going well -has colonoscopy next month -tolerating statin -Requesting medication to help with ED  Current Medication: Outpatient Encounter Medications as of 08/23/2020  Medication Sig   Fluticasone-Umeclidin-Vilant (TRELEGY ELLIPTA) 100-62.5-25 MCG/INH AEPB Inhale 1 puff into the lungs daily.   tadalafil (CIALIS) 5 MG tablet Take 1 tablet (5 mg total) by mouth daily.   traZODone (DESYREL) 50 MG tablet Take one tab at bedtime for sleep   [DISCONTINUED] atorvastatin (LIPITOR) 10 MG tablet Take 1 tablet (10 mg total) by mouth daily.   [DISCONTINUED] pneumococcal 20-Val Conj Vacc (PREVNAR 20) 0.5 ML injection Inject 0.5 mLs into the muscle tomorrow at 10 am.   albuterol (VENTOLIN HFA) 108 (90 Base) MCG/ACT inhaler Inhale 2 puffs into the lungs every 6 (six) hours as needed for wheezing or shortness of breath.   atorvastatin (LIPITOR) 10 MG tablet Take 1 tablet (10 mg total) by mouth daily.   No facility-administered encounter medications on file as of 08/23/2020.    Surgical History: Past Surgical History:  Procedure Laterality Date   CHEST TUBE INSERTION      Medical History: Past Medical History:  Diagnosis Date   COPD (chronic obstructive pulmonary disease) (HCC)    Hyperlipidemia     Pneumothorax     Family History: Family History  Problem Relation Age of Onset   Cancer Mother    Anxiety disorder Brother    COPD Brother     Social History   Socioeconomic History   Marital status: Married    Spouse name: Not on file   Number of children: Not on file   Years of education: Not on file   Highest education level: Not on file  Occupational History   Not on file  Tobacco Use   Smoking status: Every Day    Packs/day: 0.50    Years: 40.00    Pack years: 20.00    Types: Cigarettes   Smokeless tobacco: Never   Tobacco comments:    Pt down to 1/2 pk or less daily 08/16/20  Substance and Sexual Activity   Alcohol use: Not Currently   Drug use: Never   Sexual activity: Not on file  Other Topics Concern   Not on file  Social History Narrative   Not on file   Social Determinants of Health   Financial Resource Strain: Not on file  Food Insecurity: Not on file  Transportation Needs: Not on file  Physical Activity: Not on file  Stress: Not on file  Social Connections: Not on file  Intimate Partner Violence: Not on file      Review of Systems  Constitutional:  Negative for chills, fatigue and unexpected weight change.  HENT:  Negative for congestion, postnasal drip, rhinorrhea, sneezing and sore throat.  Eyes:  Negative for redness.  Respiratory:  Negative for cough, chest tightness, shortness of breath and wheezing.   Cardiovascular:  Negative for chest pain and palpitations.  Gastrointestinal:  Negative for abdominal pain, constipation, diarrhea, nausea and vomiting.  Genitourinary:  Negative for dysuria and frequency.       ED  Musculoskeletal:  Negative for arthralgias, back pain, joint swelling and neck pain.  Skin:  Negative for rash.  Neurological: Negative.  Negative for tremors and numbness.  Hematological:  Negative for adenopathy. Does not bruise/bleed easily.  Psychiatric/Behavioral:  Positive for sleep disturbance. Negative for  behavioral problems (Depression) and suicidal ideas. The patient is not nervous/anxious.    Vital Signs: BP 140/80   Pulse 78   Temp 97.8 F (36.6 C)   Resp 16   Ht 5\' 9"  (1.753 m)   Wt 140 lb (63.5 kg)   SpO2 96%   BMI 20.67 kg/m    Physical Exam Vitals and nursing note reviewed.  Constitutional:      General: He is not in acute distress.    Appearance: He is well-developed and normal weight. He is not diaphoretic.  HENT:     Head: Normocephalic and atraumatic.     Mouth/Throat:     Pharynx: No oropharyngeal exudate.  Eyes:     Pupils: Pupils are equal, round, and reactive to light.  Neck:     Thyroid: No thyromegaly.     Vascular: No JVD.     Trachea: No tracheal deviation.  Cardiovascular:     Rate and Rhythm: Normal rate and regular rhythm.     Heart sounds: Normal heart sounds. No murmur heard.   No friction rub. No gallop.  Pulmonary:     Effort: Pulmonary effort is normal. No respiratory distress.     Breath sounds: No wheezing or rales.  Chest:     Chest wall: No tenderness.  Abdominal:     General: Bowel sounds are normal.     Palpations: Abdomen is soft.  Musculoskeletal:        General: Normal range of motion.     Cervical back: Normal range of motion and neck supple.  Lymphadenopathy:     Cervical: No cervical adenopathy.  Skin:    General: Skin is warm and dry.  Neurological:     Mental Status: He is alert and oriented to person, place, and time.     Cranial Nerves: No cranial nerve deficit.  Psychiatric:        Behavior: Behavior normal.        Thought Content: Thought content normal.        Judgment: Judgment normal.       Assessment/Plan: 1. Chronic obstructive pulmonary disease, unspecified COPD type (HCC) Continue inhalers as prescribed--followed by pulm  2. Aortic atherosclerosis (HCC) Continue Lipitor - atorvastatin (LIPITOR) 10 MG tablet; Take 1 tablet (10 mg total) by mouth daily.  Dispense: 90 tablet; Refill: 3  3.  Hypercholesteremia Continue Lipitor - atorvastatin (LIPITOR) 10 MG tablet; Take 1 tablet (10 mg total) by mouth daily.  Dispense: 90 tablet; Refill: 3  4. Cigarette nicotine dependence with nicotine-induced disorder Patient continues to work on smoking cessation.  Has cut down to less than half pack per day and is making changes with his daily routine to help aid cessation Smoking cessation counseling: Pt acknowledges the risks of long term smoking, she will try to quite smoking. Options for different medications including nicotine products, chewing gum, patch etc, Wellbutrin and  Chantix is discussed Goal and date of compete cessation is discussed Total time spent in smoking cessation is 10 min.   5. Erectile dysfunction, unspecified erectile dysfunction type Patient may take Cialis as needed for erectile dysfunction - tadalafil (CIALIS) 5 MG tablet; Take 1 tablet (5 mg total) by mouth daily.  Dispense: 30 tablet; Refill: 0   General Counseling: keonta alsip understanding of the findings of todays visit and agrees with plan of treatment. I have discussed any further diagnostic evaluation that may be needed or ordered today. We also reviewed his medications today. he has been encouraged to call the office with any questions or concerns that should arise related to todays visit.    No orders of the defined types were placed in this encounter.   Meds ordered this encounter  Medications   atorvastatin (LIPITOR) 10 MG tablet    Sig: Take 1 tablet (10 mg total) by mouth daily.    Dispense:  90 tablet    Refill:  3   tadalafil (CIALIS) 5 MG tablet    Sig: Take 1 tablet (5 mg total) by mouth daily.    Dispense:  30 tablet    Refill:  0    This patient was seen by Lynn Ito, PA-C in collaboration with Dr. Beverely Risen as a part of collaborative care agreement.   Total time spent:35 Minutes Time spent includes review of chart, medications, test results, and follow up plan  with the patient.      Dr Lyndon Code Internal medicine

## 2020-09-17 ENCOUNTER — Other Ambulatory Visit: Payer: Self-pay | Admitting: Internal Medicine

## 2020-09-17 DIAGNOSIS — J449 Chronic obstructive pulmonary disease, unspecified: Secondary | ICD-10-CM

## 2020-09-20 ENCOUNTER — Other Ambulatory Visit: Payer: Self-pay | Admitting: Physician Assistant

## 2020-09-20 DIAGNOSIS — N529 Male erectile dysfunction, unspecified: Secondary | ICD-10-CM

## 2020-09-23 ENCOUNTER — Telehealth: Payer: Self-pay

## 2020-09-23 NOTE — Telephone Encounter (Signed)
PA sent for TRELEGY ELLIPTA 100-62.5-25 MCG inhaler 09/23/20 @10 :20 pm

## 2020-10-16 ENCOUNTER — Other Ambulatory Visit: Payer: Self-pay | Admitting: Physician Assistant

## 2020-10-16 DIAGNOSIS — N529 Male erectile dysfunction, unspecified: Secondary | ICD-10-CM

## 2020-10-16 NOTE — Telephone Encounter (Signed)
To soon  For refill

## 2020-10-20 ENCOUNTER — Other Ambulatory Visit: Payer: Self-pay | Admitting: Internal Medicine

## 2020-10-20 DIAGNOSIS — F411 Generalized anxiety disorder: Secondary | ICD-10-CM

## 2020-12-19 ENCOUNTER — Telehealth: Payer: Self-pay

## 2020-12-19 NOTE — Telephone Encounter (Signed)
Left mychart msg to confirm 12/21/20 appointment-Toni

## 2020-12-21 ENCOUNTER — Encounter: Payer: Self-pay | Admitting: Physician Assistant

## 2020-12-21 ENCOUNTER — Ambulatory Visit (INDEPENDENT_AMBULATORY_CARE_PROVIDER_SITE_OTHER): Payer: PPO | Admitting: Physician Assistant

## 2020-12-21 ENCOUNTER — Other Ambulatory Visit: Payer: Self-pay

## 2020-12-21 VITALS — BP 132/71 | HR 96 | Temp 98.0°F | Resp 16 | Ht 69.0 in | Wt 132.0 lb

## 2020-12-21 DIAGNOSIS — Z0001 Encounter for general adult medical examination with abnormal findings: Secondary | ICD-10-CM | POA: Diagnosis not present

## 2020-12-21 DIAGNOSIS — J449 Chronic obstructive pulmonary disease, unspecified: Secondary | ICD-10-CM

## 2020-12-21 DIAGNOSIS — R5383 Other fatigue: Secondary | ICD-10-CM

## 2020-12-21 DIAGNOSIS — F17219 Nicotine dependence, cigarettes, with unspecified nicotine-induced disorders: Secondary | ICD-10-CM | POA: Diagnosis not present

## 2020-12-21 DIAGNOSIS — F411 Generalized anxiety disorder: Secondary | ICD-10-CM | POA: Diagnosis not present

## 2020-12-21 DIAGNOSIS — R300 Dysuria: Secondary | ICD-10-CM

## 2020-12-21 DIAGNOSIS — R7989 Other specified abnormal findings of blood chemistry: Secondary | ICD-10-CM

## 2020-12-21 DIAGNOSIS — N529 Male erectile dysfunction, unspecified: Secondary | ICD-10-CM

## 2020-12-21 DIAGNOSIS — Z125 Encounter for screening for malignant neoplasm of prostate: Secondary | ICD-10-CM

## 2020-12-21 DIAGNOSIS — I700 Atherosclerosis of aorta: Secondary | ICD-10-CM

## 2020-12-21 MED ORDER — FLUTICASONE-UMECLIDIN-VILANT 100-62.5-25 MCG/ACT IN AEPB: 1.0000 | INHALATION_SPRAY | Freq: Every day | RESPIRATORY_TRACT | 3 refills | Status: DC

## 2020-12-21 MED ORDER — TRAZODONE HCL 50 MG PO TABS: ORAL_TABLET | ORAL | 0 refills | Status: DC

## 2020-12-21 MED ORDER — TADALAFIL 5 MG PO TABS: ORAL_TABLET | ORAL | 0 refills | Status: DC

## 2020-12-21 NOTE — Progress Notes (Signed)
[facility] [address] [place], [place] [address]  Internal MEDICINE  Office Visit Note  Patient Name: Chase Burnett  [id number]  [id number]  Date of Service: 12/21/2020  Chief Complaint  Patient presents with   Annual Exam   Hyperlipidemia   COPD     HPI Pt is here for routine health maintenance examination -Recently retired and is doing well since then. He has been working to [employer]. Kept him very active, but cold air now is limiting outdoor work.  -Recent breathing flare for 1-2 days after traveling to the beach with grandkids. Admits he used a nebulizer treatment from a family member and reports his breathing was actually worse the day after. He cannot recall if he took his trelegy inhaler and thinks it may also have been due to that or changing weather. He is wheezing some still today. -Everything on colonoscopy looked good and was told to follow up in 10years -Needs to get a flu shot and covid booster and will let office know dates when he does -Sleeping has been good. Takes trazodone nightly. -Trying to eat more and gain some weight -Smoking <1ppd, working to cut down further and eventually quit. -Due for routine fasting labs in Jan and will have these done in the next few weeks.  Current Medication: Outpatient Encounter Medications as of 12/21/2020  Medication Sig   atorvastatin (LIPITOR) 10 MG tablet Take 1 tablet (10 mg total) by mouth daily.   [DISCONTINUED] tadalafil (CIALIS) 5 MG tablet TAKE 1 TABLET(5 MG) BY MOUTH DAILY   [DISCONTINUED] traZODone (DESYREL) 50 MG tablet TAKE 1 TABLET BY MOUTH AT BEDTIME FOR SLEEP   [DISCONTINUED] TRELEGY ELLIPTA 100-62.5-25 MCG/INH AEPB INHALE 1 PUFF INTO THE LUNGS DAILY   albuterol (VENTOLIN HFA) 108 (90 Base) MCG/ACT inhaler Inhale 2 puffs into the lungs every 6 (six) hours as needed for wheezing or shortness of breath.   Fluticasone-Umeclidin-Vilant (TRELEGY ELLIPTA) 100-62.5-25 MCG/ACT AEPB  Inhale 1 puff into the lungs daily.   tadalafil (CIALIS) 5 MG tablet TAKE 1 TABLET(5 MG) BY MOUTH DAILY   traZODone (DESYREL) 50 MG tablet TAKE 1 TABLET BY MOUTH AT BEDTIME FOR SLEEP   No facility-administered encounter medications on file as of 12/21/2020.    Surgical History: Past Surgical History:  Procedure Laterality Date   CHEST TUBE INSERTION      Medical History: Past Medical History:  Diagnosis Date   COPD (chronic obstructive pulmonary disease) (HCC)    Hyperlipidemia    Pneumothorax     Family History: Family History  Problem Relation Age of Onset   Cancer Mother    Anxiety disorder Brother    COPD Brother       Review of Systems  Constitutional:  Negative for chills, fatigue and unexpected weight change.  HENT:  Negative for congestion, postnasal drip, rhinorrhea, sneezing and sore throat.   Eyes:  Negative for redness.  Respiratory:  Positive for wheezing. Negative for cough, chest tightness and shortness of breath.   Cardiovascular:  Negative for chest pain and palpitations.  Gastrointestinal:  Negative for abdominal pain, constipation, diarrhea, nausea and vomiting.  Genitourinary:  Negative for dysuria and frequency.       ED  Musculoskeletal:  Negative for arthralgias, back pain, joint swelling and neck pain.  Skin:  Negative for rash.  Neurological: Negative.  Negative for tremors and numbness.  Hematological:  Negative for adenopathy. Does not bruise/bleed easily.  Psychiatric/Behavioral:  Positive for sleep disturbance. Negative for behavioral problems (  Depression) and suicidal ideas. The patient is not nervous/anxious.     Vital Signs: BP 132/71    Pulse 96    Temp 98 F (36.7 C)    Resp 16    Ht 5\' 9"  (1.753 m)    Wt 132 lb (59.9 kg)    SpO2 98%    BMI 19.49 kg/m    Physical Exam Vitals and nursing note reviewed.  Constitutional:      General: He is not in acute distress.    Appearance: He is well-developed and normal weight. He is not  diaphoretic.  HENT:     Head: Normocephalic and atraumatic.     Mouth/Throat:     Pharynx: No oropharyngeal exudate.  Eyes:     Pupils: Pupils are equal, round, and reactive to light.  Neck:     Thyroid: No thyromegaly.     Vascular: No JVD.     Trachea: No tracheal deviation.  Cardiovascular:     Rate and Rhythm: Normal rate and regular rhythm.     Heart sounds: Normal heart sounds. No murmur heard.   No friction rub. No gallop.  Pulmonary:     Effort: Pulmonary effort is normal. No respiratory distress.     Breath sounds: Wheezing present. No rales.  Chest:     Chest wall: No tenderness.  Abdominal:     General: Bowel sounds are normal.     Palpations: Abdomen is soft.  Musculoskeletal:        General: Normal range of motion.     Cervical back: Normal range of motion and neck supple.     Right lower leg: No edema.     Left lower leg: No edema.  Lymphadenopathy:     Cervical: No cervical adenopathy.  Skin:    General: Skin is warm and dry.  Neurological:     Mental Status: He is alert and oriented to person, place, and time.     Cranial Nerves: No cranial nerve deficit.  Psychiatric:        Behavior: Behavior normal.        Thought Content: Thought content normal.        Judgment: Judgment normal.     LABS: No results found for this or any previous visit (from the past 2160 hour(s)).      Assessment/Plan: 1. Encounter for general adult medical examination with abnormal findings CPE performed, routine fasting labs ordered, up-to-date on colonoscopy  2. Chronic obstructive pulmonary disease, unspecified COPD type (HCC) Followed with pulmonology, continue Trelegy - Fluticasone-Umeclidin-Vilant (TRELEGY ELLIPTA) 100-62.5-25 MCG/ACT AEPB; Inhale 1 puff into the lungs daily.  Dispense: 60 each; Refill: 3  3. Generalized anxiety disorder May continue trazodone before bed - traZODone (DESYREL) 50 MG tablet; TAKE 1 TABLET BY MOUTH AT BEDTIME FOR SLEEP  Dispense: 90  tablet; Refill: 0  4. Cigarette nicotine dependence with nicotine-induced disorder Smoking cessation counseling: Pt acknowledges the risks of long term smoking, she will try to quite smoking. Options for different medications including nicotine products, chewing gum, patch etc, Wellbutrin and Chantix is discussed Goal and date of compete cessation is discussed Total time spent in smoking cessation is 10 min.   5. Aortic atherosclerosis (HCC) Continue Lipitor, will update labs - Lipid Panel With LDL/HDL Ratio  6. Erectile dysfunction, unspecified erectile dysfunction type May continue Cialis as needed - tadalafil (CIALIS) 5 MG tablet; TAKE 1 TABLET(5 MG) BY MOUTH DAILY  Dispense: 30 tablet; Refill: 0  7. Abnormal thyroid blood  test - TSH + free T4  8. Screening PSA (prostate specific antigen) - PSA Total (Reflex To Free)  9. Other fatigue - CBC w/Diff/Platelet - Comprehensive metabolic panel   General Counseling: mehmet scally understanding of the findings of todays visit and agrees with plan of treatment. I have discussed any further diagnostic evaluation that may be needed or ordered today. We also reviewed his medications today. he has been encouraged to call the office with any questions or concerns that should arise related to todays visit.    Counseling:    Orders Placed This Encounter  Procedures   CBC w/Diff/Platelet   Comprehensive metabolic panel   Lipid Panel With LDL/HDL Ratio   TSH + free T4   PSA Total (Reflex To Free)    Meds ordered this encounter  Medications   tadalafil (CIALIS) 5 MG tablet    Sig: TAKE 1 TABLET(5 MG) BY MOUTH DAILY    Dispense:  30 tablet    Refill:  0   traZODone (DESYREL) 50 MG tablet    Sig: TAKE 1 TABLET BY MOUTH AT BEDTIME FOR SLEEP    Dispense:  90 tablet    Refill:  0   Fluticasone-Umeclidin-Vilant (TRELEGY ELLIPTA) 100-62.5-25 MCG/ACT AEPB    Sig: Inhale 1 puff into the lungs daily.    Dispense:  60 each     Refill:  3    This patient was seen by Lynn Ito, PA-C in collaboration with Dr. Beverely Risen as a part of collaborative care agreement.  Total time spent:30 Minutes  Time spent includes review of chart, medications, test results, and follow up plan with the patient.     Lyndon Code, MD  Internal Medicine

## 2020-12-21 NOTE — Addendum Note (Signed)
Addended by: Jeannetta Ellis on: 12/21/2020 03:34 PM   Modules accepted: Orders

## 2020-12-26 LAB — UA/M W/RFLX CULTURE, ROUTINE
Bilirubin, UA: NEGATIVE
Glucose, UA: NEGATIVE
Nitrite, UA: NEGATIVE
Protein,UA: NEGATIVE
RBC, UA: NEGATIVE
Specific Gravity, UA: 1.026 (ref 1.005–1.030)
Urobilinogen, Ur: 0.2 mg/dL (ref 0.2–1.0)
pH, UA: 5.5 (ref 5.0–7.5)

## 2020-12-26 LAB — MICROSCOPIC EXAMINATION
Bacteria, UA: NONE SEEN
Casts: NONE SEEN /lpf

## 2020-12-26 LAB — URINE CULTURE, REFLEX: Organism ID, Bacteria: NO GROWTH

## 2021-01-05 ENCOUNTER — Other Ambulatory Visit: Payer: Self-pay

## 2021-01-05 MED ORDER — ALPRAZOLAM 0.25 MG PO TABS: ORAL_TABLET | ORAL | 0 refills | Status: DC

## 2021-01-05 MED ORDER — PREDNISONE 10 MG PO TABS: ORAL_TABLET | ORAL | 0 refills | Status: DC

## 2021-01-05 MED ORDER — AZITHROMYCIN 250 MG PO TABS: ORAL_TABLET | ORAL | 0 refills | Status: DC

## 2021-01-05 NOTE — Telephone Encounter (Signed)
Pt called c/o having anxiety and having trouble sleeping due to being congested, and feeling he has bronchitis, coughing up thick mucus, per DFK we sent in xanax 0.25 mg once a day as needed, prednisone 10 mg Take one tab 3 x day for 3 days, then take one tab 2 x a day for 3 days and then take one tab a day for 3 days for copd, and azithromycin 250 mg once a day for 10 days to [employer]. [place].  Pt informed

## 2021-01-07 ENCOUNTER — Telehealth: Payer: Self-pay

## 2021-01-07 NOTE — Telephone Encounter (Signed)
Called to check on pt and see if he was feeling better, pt advised he was so thankful for helping him and he was able to get some rest.  He also stated he was having symptoms of hyperventilating but is much better on that due to the medications given.  Pt asked to that DFK for helping him.  Staed that he still has a little bit to go with the bronchitis but is feeling better some.  Pt informed to call back if he needs anything

## 2021-01-11 ENCOUNTER — Other Ambulatory Visit: Payer: Self-pay | Admitting: Internal Medicine

## 2021-01-20 ENCOUNTER — Other Ambulatory Visit: Payer: Self-pay | Admitting: Physician Assistant

## 2021-01-20 DIAGNOSIS — N529 Male erectile dysfunction, unspecified: Secondary | ICD-10-CM

## 2021-02-14 ENCOUNTER — Ambulatory Visit (INDEPENDENT_AMBULATORY_CARE_PROVIDER_SITE_OTHER): Payer: PPO | Admitting: Internal Medicine

## 2021-02-14 ENCOUNTER — Other Ambulatory Visit: Payer: Self-pay

## 2021-02-14 ENCOUNTER — Encounter: Payer: Self-pay | Admitting: Internal Medicine

## 2021-02-14 VITALS — BP 126/58 | HR 94 | Temp 98.3°F | Resp 16 | Ht 69.0 in | Wt 135.8 lb

## 2021-02-14 DIAGNOSIS — R0602 Shortness of breath: Secondary | ICD-10-CM

## 2021-02-14 DIAGNOSIS — J449 Chronic obstructive pulmonary disease, unspecified: Secondary | ICD-10-CM | POA: Diagnosis not present

## 2021-02-14 DIAGNOSIS — F17219 Nicotine dependence, cigarettes, with unspecified nicotine-induced disorders: Secondary | ICD-10-CM | POA: Diagnosis not present

## 2021-02-14 DIAGNOSIS — F411 Generalized anxiety disorder: Secondary | ICD-10-CM

## 2021-02-14 MED ORDER — FLUTICASONE-UMECLIDIN-VILANT 100-62.5-25 MCG/ACT IN AEPB: 1.0000 | INHALATION_SPRAY | Freq: Every day | RESPIRATORY_TRACT | 3 refills | Status: DC

## 2021-02-14 NOTE — Progress Notes (Signed)
[facility] [place], [place] [address]  Pulmonary Sleep Medicine   Office Visit Note  Patient Name: Chase Burnett DOB: [birth year] MRN [id number]  Date of Service: 02/14/2021  Complaints/HPI: He states he had some bronchitis a few months ago. Patint states that he has seen some improvement since then. Unfortunately he is still smoking. Patient states he is trying to exercise. States he still has SOB with exertion. No chest pain. States he has not been admitted since last visit  ROS  General: (-) fever, (-) chills, (-) night sweats, (-) weakness Skin: (-) rashes, (-) itching,. Eyes: (-) visual changes, (-) redness, (-) itching. Nose and Sinuses: (-) nasal stuffiness or itchiness, (-) postnasal drip, (-) nosebleeds, (-) sinus trouble. Mouth and Throat: (-) sore throat, (-) hoarseness. Neck: (-) swollen glands, (-) enlarged thyroid, (-) neck pain. Respiratory: + cough, (-) bloody sputum, + shortness of breath, - wheezing. Cardiovascular: - ankle swelling, (-) chest pain. Lymphatic: (-) lymph node enlargement. Neurologic: (-) numbness, (-) tingling. Psychiatric: (-) anxiety, (-) depression   Current Medication: Outpatient Encounter Medications as of 02/14/2021  Medication Sig   albuterol (VENTOLIN HFA) 108 (90 Base) MCG/ACT inhaler INHALE 2 PUFFS INTO THE LUNGS EVERY 6 HOURS AS NEEDED FOR WHEEZING OR SHORTNESS OF BREATH   ALPRAZolam (XANAX) 0.25 MG tablet Take 1 tablet by mouth as needed once a day   atorvastatin (LIPITOR) 10 MG tablet Take 1 tablet (10 mg total) by mouth daily.   Fluticasone-Umeclidin-Vilant (TRELEGY ELLIPTA) 100-62.5-25 MCG/ACT AEPB Inhale 1 puff into the lungs daily.   tadalafil (CIALIS) 5 MG tablet TAKE 1 TABLET(5 MG) BY MOUTH DAILY   traZODone (DESYREL) 50 MG tablet TAKE 1 TABLET BY MOUTH AT BEDTIME FOR SLEEP   [DISCONTINUED] azithromycin (ZITHROMAX) 250 MG tablet Take 1 tablet by mouth once a day for 10 days (Patient not taking:  Reported on 02/14/2021)   [DISCONTINUED] predniSONE (DELTASONE) 10 MG tablet Take one tab 3 x day for 3 days, then take one tab 2 x a day for 3 days and then take one tab a day for 3 days for copd (Patient not taking: Reported on 02/14/2021)   No facility-administered encounter medications on file as of 02/14/2021.    Surgical History: Past Surgical History:  Procedure Laterality Date   CHEST TUBE INSERTION      Medical History: Past Medical History:  Diagnosis Date   COPD (chronic obstructive pulmonary disease) ([place])    Hyperlipidemia    Pneumothorax     Family History: Family History  Problem Relation Age of Onset   Cancer Mother    Anxiety disorder Brother    COPD Brother     Social History: Social History   Socioeconomic History   Marital status: Married    Spouse name: Not on file   Number of children: Not on file   Years of education: Not on file   Highest education level: Not on file  Occupational History   Not on file  Tobacco Use   Smoking status: Every Day    Packs/day: 0.50    Years: 40.00    Pack years: 20.00    Types: Cigarettes   Smokeless tobacco: Never   Tobacco comments:    Pt down to 1/2 pk or less daily 02/14/21  Substance and Sexual Activity   Alcohol use: Not Currently   Drug use: Never   Sexual activity: Not on file  Other Topics Concern   Not on file  Social History  Narrative   Not on file   Social Determinants of Health   Financial Resource Strain: Not on file  Food Insecurity: Not on file  Transportation Needs: Not on file  Physical Activity: Not on file  Stress: Not on file  Social Connections: Not on file  Intimate Partner Violence: Not on file    Vital Signs: Blood pressure (!) 126/58, pulse 94, temperature 98.3 F (36.8 C), resp. rate 16, height _0  (1.753 m), weight 135 lb 12.8 oz (61.6 kg), SpO2 98 %.  Examination: General Appearance: The patient is well-developed, well-nourished, and in no distress. Skin: Gross  inspection of skin unremarkable. Head: normocephalic, no gross deformities. Eyes: no gross deformities noted. ENT: ears appear grossly normal no exudates. Neck: Supple. No thyromegaly. No LAD. Respiratory: few rhonchi noted. Cardiovascular: Normal S1 and S2 without murmur or rub. Extremities: No cyanosis. pulses are equal. Neurologic: Alert and oriented. No involuntary movements.  LABS: Recent Results (from the past 2160 hour(s))  UA/M w/rflx Culture, Routine     Status: Abnormal   Collection Time: 12/21/20  3:34 PM   Specimen: Urine   Urine  Result Value Ref Range   Specific Gravity, UA 1.026 1.005 - 1.030   pH, UA 5.5 5.0 - 7.5   Color, UA Yellow Yellow   Appearance Ur Clear Clear   Leukocytes,UA Trace (A) Negative   Protein,UA Negative Negative/Trace   Glucose, UA Negative Negative   Ketones, UA Trace (A) Negative   RBC, UA Negative Negative   Bilirubin, UA Negative Negative   Urobilinogen, Ur 0.2 0.2 - 1.0 mg/dL   Nitrite, UA Negative Negative   Microscopic Examination See below:     Comment: Microscopic was indicated and was performed.   Urinalysis Reflex Comment     Comment: This specimen has reflexed to a Urine Culture.  Microscopic Examination     Status: None   Collection Time: 12/21/20  3:34 PM   Urine  Result Value Ref Range   WBC, UA 0-5 0 - 5 /hpf   RBC 0-2 0 - 2 /hpf   Epithelial Cells (non renal) 0-10 0 - 10 /hpf   Casts None seen None seen /lpf   Bacteria, UA None seen None seen/Few  Urine Culture, Reflex     Status: None   Collection Time: 12/21/20  3:34 PM   Urine  Result Value Ref Range   Urine Culture, Routine Final report    Organism ID, Bacteria No growth     Radiology: DG Chest 2 View  Result Date: 05/23/2017 CLINICAL DATA:  Shortness of Breath EXAM: CHEST - 2 VIEW COMPARISON:  07/01/2004 FINDINGS: Coarsened interstitial opacities throughout the lungs, likely severe chronic interstitial lung disease. Hyperinflation/COPD. Nodular areas in  both mid lungs may represent a component of the interstitial disease but I cannot exclude other pulmonary nodules. No effusions. Heart is normal size. IMPRESSION: Chronic interstitial lung disease, COPD. Nodular densities in both mid lungs. These could be further evaluated with noncontrast chest CT. Electronically Signed   By: Rolm Baptise M.D.   On: 05/23/2017 22:26   CT CHEST WO CONTRAST  Result Date: 05/24/2017 CLINICAL DATA:  Productive cough on antibiotics four days. EXAM: CT CHEST WITHOUT CONTRAST TECHNIQUE: Multidetector CT imaging of the chest was performed following the standard protocol without IV contrast. COMPARISON:  Chest x-ray 05/23/2017 FINDINGS: Cardiovascular: Heart is normal size. Calcified plaque over the left main and 3 vessel coronary arteries. Minimal calcified plaque over the thoracic aorta. Mediastinum/Nodes: No significant  hilar or mediastinal adenopathy. Remaining mediastinal structures are unremarkable. Lungs/Pleura: Lungs are adequately inflated demonstrate moderate centrilobular emphysema. There is a patchy bilateral reticulonodular pattern of opacification. No evidence of effusion. Airways are within normal. Upper Abdomen: No acute abnormality. Mild calcified plaque over the abdominal aorta. Nonspecific 1 cm right retrocrural lymph node. Musculoskeletal: Mild degenerate changes spine with several Schmorl's nodes. IMPRESSION: Bilateral patchy reticulonodular pattern of opacification. Findings are likely due to an infectious or atypical infectious process such as mycobacterium avium complex or an inflammatory process. Recommend follow-up CT 4-6 weeks. Atherosclerotic coronary artery disease. Aortic Atherosclerosis (ICD10-I70.0). Electronically Signed   By: Marin Olp M.D.   On: 05/24/2017 14:03    No results found.  No results found.    Assessment and Plan: Patient Active Problem List   Diagnosis Date Noted   Chronic obstructive pulmonary disease ([place]) 01/11/2020    Dyspnea on exertion 01/11/2020   Screening for lung cancer 01/11/2020   Moderate cigarette smoker 01/11/2020   Coronary artery disease involving native coronary artery of native heart without angina pectoris 01/11/2020   Aortic atherosclerosis ([place]) 01/11/2020   Generalized anxiety disorder 01/11/2020   Other fatigue 01/11/2020   Screening for colon cancer 01/11/2020   Respiratory failure with hypoxia ([place]) 05/24/2017   1. Chronic obstructive pulmonary disease, unspecified COPD type ([place]) Patient is to continue with medications rehab was recommended and I have placed order for that.  Also exercise as tolerated at home - Spirometry with Graph - Pulmonary function test; Future - Pulmonary rehab therapeutic exercise; Future - Fluticasone-Umeclidin-Vilant (TRELEGY ELLIPTA) 100-62.5-25 MCG/ACT AEPB; Inhale 1 puff into the lungs daily.  Dispense: 60 each; Refill: 3  2. Shortness of breath  - Spirometry with Graph - Pulmonary function test; Future  3. Cigarette nicotine dependence with nicotine-induced disorder Smoking cessation  4. Generalized anxiety disorder Medical management patient does have a history of using Xanax as needed and trazodone also    General Counseling: I have discussed the findings of the evaluation and examination with Annie Main.  I have also discussed any further diagnostic evaluation thatmay be needed or ordered today. Jancarlo verbalizes understanding of the findings of todays visit. We also reviewed his medications today and discussed drug interactions and side effects including but not limited excessive drowsiness and altered mental states. We also discussed that there is always a risk not just to him but also people around him. he has been encouraged to call the office with any questions or concerns that should arise related to todays visit.  Orders Placed This Encounter  Procedures   Spirometry with Graph    Order Specific Question:   Where should this test be  performed?    Answer:   [facility]    Order Specific Question:   Basic spirometry    Answer:   Yes    Order Specific Question:   Spirometry pre & post bronchodilator    Answer:   No   Pulmonary function test    Standing Status:   Future    Standing Expiration Date:   02/14/2022    Order Specific Question:   Where should this test be performed?    Answer:   [facility]     Time spent: 23  I have personally obtained a history, examined the patient, evaluated laboratory and imaging results, formulated the assessment and plan and placed orders.    Allyne Gee, MD [facility] Pulmonary and Critical Care Sleep medicine

## 2021-02-14 NOTE — Patient Instructions (Signed)

## 2021-02-17 ENCOUNTER — Other Ambulatory Visit: Payer: Self-pay | Admitting: Internal Medicine

## 2021-02-23 ENCOUNTER — Other Ambulatory Visit: Payer: Self-pay | Admitting: Physician Assistant

## 2021-02-23 DIAGNOSIS — N529 Male erectile dysfunction, unspecified: Secondary | ICD-10-CM

## 2021-03-01 ENCOUNTER — Telehealth: Payer: Self-pay

## 2021-03-01 NOTE — Telephone Encounter (Signed)
Sent PA to plan for trelegy ellipta. PA pending.

## 2021-04-24 ENCOUNTER — Ambulatory Visit: Payer: PPO | Admitting: Internal Medicine

## 2021-04-27 ENCOUNTER — Other Ambulatory Visit: Payer: Self-pay | Admitting: Physician Assistant

## 2021-04-27 DIAGNOSIS — F411 Generalized anxiety disorder: Secondary | ICD-10-CM

## 2021-04-30 ENCOUNTER — Other Ambulatory Visit: Payer: Self-pay

## 2021-04-30 DIAGNOSIS — E7800 Pure hypercholesterolemia, unspecified: Secondary | ICD-10-CM

## 2021-04-30 MED ORDER — ATORVASTATIN CALCIUM 10 MG PO TABS: 10.0000 mg | ORAL_TABLET | Freq: Every day | ORAL | 1 refills | Status: DC

## 2021-05-22 ENCOUNTER — Ambulatory Visit (INDEPENDENT_AMBULATORY_CARE_PROVIDER_SITE_OTHER): Payer: PPO | Admitting: Internal Medicine

## 2021-05-22 DIAGNOSIS — R0602 Shortness of breath: Secondary | ICD-10-CM | POA: Diagnosis not present

## 2021-05-22 DIAGNOSIS — J449 Chronic obstructive pulmonary disease, unspecified: Secondary | ICD-10-CM

## 2021-05-29 NOTE — Procedures (Signed)
[facility] [address] [place], [address]    Complete Pulmonary Function Testing Interpretation:  FINDINGS:  Forced vital capacity is moderately decreased.  FEV1 is 1.08 L which is 34% of predicted and is severely decreased.  FEV1 FVC ratio is severely decreased.  Postbronchodilator there is no significant change in FEV1 or improvement may occur in the absence of spirometric improvement.  Total lung capacity is normal.  Residual volume is increased.  Residual volume to lung capacity ratio is increased.  FRC is decreased.  DLCO is mildly decreased.  IMPRESSION:  This pulmonary function study is consistent with severe obstructive lung disease clinical correlation is recommended.  Yevonne Pax, MD [facility] Pulmonary Critical Care Medicine Sleep Medicine

## 2021-05-30 ENCOUNTER — Encounter: Payer: Self-pay | Admitting: Internal Medicine

## 2021-05-30 LAB — PULMONARY FUNCTION TEST

## 2021-06-07 DIAGNOSIS — Z125 Encounter for screening for malignant neoplasm of prostate: Secondary | ICD-10-CM | POA: Diagnosis not present

## 2021-06-07 DIAGNOSIS — I700 Atherosclerosis of aorta: Secondary | ICD-10-CM | POA: Diagnosis not present

## 2021-06-07 DIAGNOSIS — R7989 Other specified abnormal findings of blood chemistry: Secondary | ICD-10-CM | POA: Diagnosis not present

## 2021-06-07 DIAGNOSIS — R5383 Other fatigue: Secondary | ICD-10-CM | POA: Diagnosis not present

## 2021-06-08 LAB — LIPID PANEL WITH LDL/HDL RATIO
Cholesterol, Total: 143 mg/dL (ref 100–199)
HDL: 67 mg/dL (ref >39)
LDL Chol Calc (NIH): 65 mg/dL (ref 0–99)
LDL/HDL Ratio: 1 ratio (ref 0.0–3.6)
Triglycerides: 48 mg/dL (ref 0–149)
VLDL Cholesterol Cal: 11 mg/dL (ref 5–40)

## 2021-06-08 LAB — CBC WITH DIFFERENTIAL/PLATELET
Basophils Absolute: 0.1 10*3/uL (ref 0.0–0.2)
Basos: 1 %
EOS (ABSOLUTE): 0.2 10*3/uL (ref 0.0–0.4)
Eos: 3 %
Hematocrit: 42.5 % (ref 37.5–51.0)
Hemoglobin: 15 g/dL (ref 13.0–17.7)
Immature Grans (Abs): 0 10*3/uL (ref 0.0–0.1)
Immature Granulocytes: 0 %
Lymphocytes Absolute: 1.5 10*3/uL (ref 0.7–3.1)
Lymphs: 32 %
MCH: 34 pg — ABNORMAL HIGH (ref 26.6–33.0)
MCHC: 35.3 g/dL (ref 31.5–35.7)
MCV: 96 fL (ref 79–97)
Monocytes Absolute: 0.5 10*3/uL (ref 0.1–0.9)
Monocytes: 11 %
Neutrophils Absolute: 2.6 10*3/uL (ref 1.4–7.0)
Neutrophils: 53 %
Platelets: 235 10*3/uL (ref 150–450)
RBC: 4.41 x10E6/uL (ref 4.14–5.80)
RDW: 12.1 % (ref 11.6–15.4)
WBC: 4.8 10*3/uL (ref 3.4–10.8)

## 2021-06-08 LAB — PSA TOTAL (REFLEX TO FREE): Prostate Specific Ag, Serum: 0.4 ng/mL (ref 0.0–4.0)

## 2021-06-08 LAB — COMPREHENSIVE METABOLIC PANEL
ALT: 14 IU/L (ref 0–44)
AST: 16 IU/L (ref 0–40)
Albumin/Globulin Ratio: 2 (ref 1.2–2.2)
Albumin: 4.5 g/dL (ref 3.8–4.8)
Alkaline Phosphatase: 105 IU/L (ref 44–121)
BUN/Creatinine Ratio: 21 (ref 10–24)
BUN: 15 mg/dL (ref 8–27)
Bilirubin Total: 0.5 mg/dL (ref 0.0–1.2)
CO2: 23 mmol/L (ref 20–29)
Calcium: 9.9 mg/dL (ref 8.6–10.2)
Chloride: 101 mmol/L (ref 96–106)
Creatinine, Ser: 0.73 mg/dL — ABNORMAL LOW (ref 0.76–1.27)
Globulin, Total: 2.3 g/dL (ref 1.5–4.5)
Glucose: 96 mg/dL (ref 70–99)
Potassium: 5 mmol/L (ref 3.5–5.2)
Sodium: 138 mmol/L (ref 134–144)
Total Protein: 6.8 g/dL (ref 6.0–8.5)
eGFR: 101 mL/min/{1.73_m2} (ref >59)

## 2021-06-08 LAB — TSH+FREE T4
Free T4: 1.53 ng/dL (ref 0.82–1.77)
TSH: 1.91 u[IU]/mL (ref 0.450–4.500)

## 2021-06-21 ENCOUNTER — Encounter: Payer: Self-pay | Admitting: Physician Assistant

## 2021-06-21 ENCOUNTER — Ambulatory Visit (INDEPENDENT_AMBULATORY_CARE_PROVIDER_SITE_OTHER): Payer: PPO | Admitting: Physician Assistant

## 2021-06-21 DIAGNOSIS — F419 Anxiety disorder, unspecified: Secondary | ICD-10-CM | POA: Diagnosis not present

## 2021-06-21 DIAGNOSIS — J449 Chronic obstructive pulmonary disease, unspecified: Secondary | ICD-10-CM

## 2021-06-21 DIAGNOSIS — F17219 Nicotine dependence, cigarettes, with unspecified nicotine-induced disorders: Secondary | ICD-10-CM | POA: Diagnosis not present

## 2021-06-21 MED ORDER — HYDROXYZINE PAMOATE 25 MG PO CAPS: 25.0000 mg | ORAL_CAPSULE | Freq: Three times a day (TID) | ORAL | 0 refills | Status: DC | PRN

## 2021-06-21 MED ORDER — FLUTICASONE-UMECLIDIN-VILANT 100-62.5-25 MCG/ACT IN AEPB: 1.0000 | INHALATION_SPRAY | Freq: Every day | RESPIRATORY_TRACT | 3 refills | Status: DC

## 2021-06-21 NOTE — Progress Notes (Signed)
[facility] [address] [place], [place] [address]  Internal MEDICINE  Office Visit Note  Patient Name: Chase Burnett  [id number]  [id number]  Date of Service: 07/05/2021  Chief Complaint  Patient presents with   Follow-up    PFT   Results    Labs     HPI Pt is here for routine follow up -Takes trazodone at night and this helps, but still has some anxiety during the day -Has taken klonopin in the past and it would help. Had done xanax before too and didn't really help. He is willing to try alternative such as hydroxyzine, but if not helping may call back for limited klonopin script to be taken only as needed -getting ready to go on vacation with grandkids -Labs reviewed and look much better. Choelsterol now well controlled -PFT updated and showed FEV1 1.08L, 34%, slightly improved from .92L and 28% predicted last time -He is currently smoking less than a pack per day, he is followed by pulmonology  Current Medication: Outpatient Encounter Medications as of 06/21/2021  Medication Sig   albuterol (VENTOLIN HFA) 108 (90 Base) MCG/ACT inhaler INHALE 2 PUFFS INTO THE LUNGS EVERY 6 HOURS AS NEEDED FOR WHEEZING OR SHORTNESS OF BREATH   atorvastatin (LIPITOR) 10 MG tablet Take 1 tablet (10 mg total) by mouth daily.   hydrOXYzine (VISTARIL) 25 MG capsule Take 1 capsule (25 mg total) by mouth every 8 (eight) hours as needed.   traZODone (DESYREL) 50 MG tablet TAKE 1 TABLET BY MOUTH AT BEDTIME FOR SLEEP   [DISCONTINUED] ALPRAZolam (XANAX) 0.25 MG tablet Take 1 tablet by mouth as needed once a day   [DISCONTINUED] Fluticasone-Umeclidin-Vilant (TRELEGY ELLIPTA) 100-62.5-25 MCG/ACT AEPB Inhale 1 puff into the lungs daily.   [DISCONTINUED] tadalafil (CIALIS) 5 MG tablet TAKE 1 TABLET(5 MG) BY MOUTH DAILY   Fluticasone-Umeclidin-Vilant (TRELEGY ELLIPTA) 100-62.5-25 MCG/ACT AEPB Inhale 1 puff into the lungs daily.   No facility-administered encounter medications on file as of  06/21/2021.    Surgical History: Past Surgical History:  Procedure Laterality Date   CHEST TUBE INSERTION      Medical History: Past Medical History:  Diagnosis Date   COPD (chronic obstructive pulmonary disease) (HCC)    Hyperlipidemia    Pneumothorax     Family History: Family History  Problem Relation Age of Onset   Cancer Mother    Anxiety disorder Brother    COPD Brother     Social History   Socioeconomic History   Marital status: Married    Spouse name: Not on file   Number of children: Not on file   Years of education: Not on file   Highest education level: Not on file  Occupational History   Not on file  Tobacco Use   Smoking status: Every Day    Packs/day: 0.50    Years: 40.00    Total pack years: 20.00    Types: Cigarettes   Smokeless tobacco: Never   Tobacco comments:    Pt down to 1/2 pk or less daily 02/14/21  Substance and Sexual Activity   Alcohol use: Not Currently   Drug use: Never   Sexual activity: Not on file  Other Topics Concern   Not on file  Social History Narrative   Not on file   Social Determinants of Health   Financial Resource Strain: Not on file  Food Insecurity: Not on file  Transportation Needs: Not on file  Physical Activity: Not on file  Stress: Not  on file  Social Connections: Not on file  Intimate Partner Violence: Not on file      Review of Systems  Constitutional:  Negative for chills, fatigue and unexpected weight change.  HENT:  Negative for congestion, postnasal drip, rhinorrhea, sneezing and sore throat.   Eyes:  Negative for redness.  Respiratory:  Negative for cough, chest tightness, shortness of breath and wheezing.   Cardiovascular:  Negative for chest pain and palpitations.  Gastrointestinal:  Negative for abdominal pain, constipation, diarrhea, nausea and vomiting.  Genitourinary:  Negative for dysuria and frequency.  Musculoskeletal:  Negative for arthralgias, back pain, joint swelling and neck  pain.  Skin:  Negative for rash.  Neurological: Negative.  Negative for tremors and numbness.  Hematological:  Negative for adenopathy. Does not bruise/bleed easily.  Psychiatric/Behavioral:  Positive for sleep disturbance. Negative for behavioral problems (Depression) and suicidal ideas. The patient is nervous/anxious.     Vital Signs: BP 129/73   Pulse 72   Temp 98.4 F (36.9 C)   Resp 16   Ht 5\' 8"  (1.727 m)   Wt 134 lb 6.4 oz (61 kg)   SpO2 98%   BMI 20.44 kg/m    Physical Exam Vitals and nursing note reviewed.  Constitutional:      General: He is not in acute distress.    Appearance: He is well-developed and normal weight. He is not diaphoretic.  HENT:     Head: Normocephalic and atraumatic.     Mouth/Throat:     Pharynx: No oropharyngeal exudate.  Eyes:     Pupils: Pupils are equal, round, and reactive to light.  Neck:     Thyroid: No thyromegaly.     Vascular: No JVD.     Trachea: No tracheal deviation.  Cardiovascular:     Rate and Rhythm: Normal rate and regular rhythm.     Heart sounds: Normal heart sounds. No murmur heard.    No friction rub. No gallop.  Pulmonary:     Effort: Pulmonary effort is normal. No respiratory distress.     Breath sounds: Wheezing present. No rales.  Chest:     Chest wall: No tenderness.  Abdominal:     General: Bowel sounds are normal.     Palpations: Abdomen is soft.  Musculoskeletal:        General: Normal range of motion.     Cervical back: Normal range of motion and neck supple.     Right lower leg: No edema.     Left lower leg: No edema.  Lymphadenopathy:     Cervical: No cervical adenopathy.  Skin:    General: Skin is warm and dry.  Neurological:     Mental Status: He is alert and oriented to person, place, and time.     Cranial Nerves: No cranial nerve deficit.  Psychiatric:        Behavior: Behavior normal.        Thought Content: Thought content normal.        Judgment: Judgment normal.         Assessment/Plan: 1. Anxiety May try hydroxyzine as needed for anxiety. If not helping then may call back for limited klonopin script to be used only as needed. - hydrOXYzine (VISTARIL) 25 MG capsule; Take 1 capsule (25 mg total) by mouth every 8 (eight) hours as needed.  Dispense: 30 capsule; Refill: 0  2. Chronic obstructive pulmonary disease, unspecified COPD type (HCC) PFT reviewed, continue trelegy as before. Followed by pulmonology - Fluticasone-Umeclidin-Vilant (  TRELEGY ELLIPTA) 100-62.5-25 MCG/ACT AEPB; Inhale 1 puff into the lungs daily.  Dispense: 60 each; Refill: 3  3. Cigarette nicotine dependence with nicotine-induced disorder Continue to work on smoking cessation   General Counseling: selmer adduci understanding of the findings of todays visit and agrees with plan of treatment. I have discussed any further diagnostic evaluation that may be needed or ordered today. We also reviewed his medications today. he has been encouraged to call the office with any questions or concerns that should arise related to todays visit.    No orders of the defined types were placed in this encounter.   Meds ordered this encounter  Medications   hydrOXYzine (VISTARIL) 25 MG capsule    Sig: Take 1 capsule (25 mg total) by mouth every 8 (eight) hours as needed.    Dispense:  30 capsule    Refill:  0   Fluticasone-Umeclidin-Vilant (TRELEGY ELLIPTA) 100-62.5-25 MCG/ACT AEPB    Sig: Inhale 1 puff into the lungs daily.    Dispense:  60 each    Refill:  3    This patient was seen by Lynn Ito, PA-C in collaboration with Dr. Beverely Risen as a part of collaborative care agreement.   Total time spent:30 Minutes Time spent includes review of chart, medications, test results, and follow up plan with the patient.      Dr Lyndon Code Internal medicine

## 2021-07-03 ENCOUNTER — Other Ambulatory Visit: Payer: Self-pay | Admitting: Physician Assistant

## 2021-07-03 ENCOUNTER — Telehealth: Payer: Self-pay

## 2021-07-03 DIAGNOSIS — F411 Generalized anxiety disorder: Secondary | ICD-10-CM

## 2021-07-03 MED ORDER — CLONAZEPAM 0.5 MG PO TABS: ORAL_TABLET | ORAL | 0 refills | Status: DC

## 2021-07-03 NOTE — Telephone Encounter (Signed)
Pt advised that she send klonopin  advised take 1/2 to 1 tab po daily as needed

## 2021-07-29 ENCOUNTER — Other Ambulatory Visit: Payer: Self-pay | Admitting: Physician Assistant

## 2021-07-29 DIAGNOSIS — F411 Generalized anxiety disorder: Secondary | ICD-10-CM

## 2021-08-26 ENCOUNTER — Ambulatory Visit (INDEPENDENT_AMBULATORY_CARE_PROVIDER_SITE_OTHER): Payer: Self-pay | Admitting: Internal Medicine

## 2021-08-26 ENCOUNTER — Encounter: Payer: Self-pay | Admitting: Internal Medicine

## 2021-08-26 ENCOUNTER — Other Ambulatory Visit: Payer: Self-pay | Admitting: Physician Assistant

## 2021-08-26 VITALS — BP 132/73 | HR 94 | Temp 98.3°F | Resp 16 | Ht 69.0 in | Wt 136.0 lb

## 2021-08-26 DIAGNOSIS — J449 Chronic obstructive pulmonary disease, unspecified: Secondary | ICD-10-CM | POA: Diagnosis not present

## 2021-08-26 DIAGNOSIS — F17219 Nicotine dependence, cigarettes, with unspecified nicotine-induced disorders: Secondary | ICD-10-CM | POA: Diagnosis not present

## 2021-08-26 DIAGNOSIS — R0602 Shortness of breath: Secondary | ICD-10-CM

## 2021-08-26 DIAGNOSIS — J4489 Other specified chronic obstructive pulmonary disease: Secondary | ICD-10-CM

## 2021-08-26 DIAGNOSIS — F411 Generalized anxiety disorder: Secondary | ICD-10-CM

## 2021-08-26 MED ORDER — CLONAZEPAM 0.5 MG PO TABS: ORAL_TABLET | ORAL | 0 refills | Status: DC

## 2021-08-26 NOTE — Patient Instructions (Signed)
Steps to Quit Smoking Smoking tobacco is the leading cause of preventable death. It can affect almost every organ in the body. Smoking puts you and people around you at risk for many serious, long-lasting (chronic) diseases. Quitting smoking can be hard, but it is one of the best things that you can do for your health. It is never too late to quit. Do not give up if you cannot quit the first time. Some people need to try many times to quit. Do your best to stick to your quit plan, and talk with your doctor if you have any questions or concerns. How do I get ready to quit? Pick a date to quit. Set a date within the next 2 weeks to give you time to prepare. Write down the reasons why you are quitting. Keep this list in places where you will see it often. Tell your family, friends, and co-workers that you are quitting. Their support is important. Talk with your doctor about the choices that may help you quit. Find out if your health insurance will pay for these treatments. Know the people, places, things, and activities that make you want to smoke (triggers). Avoid them. What first steps can I take to quit smoking? Throw away all cigarettes at home, at work, and in your car. Throw away the things that you use when you smoke, such as ashtrays and lighters. Clean your car. Empty the ashtray. Clean your home, including curtains and carpets. What can I do to help me quit smoking? Talk with your doctor about taking medicines and seeing a counselor. You are more likely to succeed when you do both. If you are pregnant or breastfeeding: Talk with your doctor about counseling or other ways to quit smoking. Do not take medicine to help you quit smoking unless your doctor tells you to. Quit right away Quit smoking completely, instead of slowly cutting back on how much you smoke over a period of time. Stopping smoking right away may be more successful than slowly quitting. Go to counseling. In-person is best  if this is an option. You are more likely to quit if you go to counseling sessions regularly. Take medicine You may take medicines to help you quit. Some medicines need a prescription, and some you can buy over-the-counter. Some medicines may contain a drug called nicotine to replace the nicotine in cigarettes. Medicines may: Help you stop having the desire to smoke (cravings). Help to stop the problems that come when you stop smoking (withdrawal symptoms). Your doctor may ask you to use: Nicotine patches, gum, or lozenges. Nicotine inhalers or sprays. Non-nicotine medicine that you take by mouth. Find resources Find resources and other ways to help you quit smoking and remain smoke-free after you quit. They include: Online chats with a counselor. Phone quitlines. Printed self-help materials. Support groups or group counseling. Text messaging programs. Mobile phone apps. Use apps on your mobile phone or tablet that can help you stick to your quit plan. Examples of free services include Quit Guide from the CDC and smokefree.gov  What can I do to make it easier to quit?  Talk to your family and friends. Ask them to support and encourage you. Call a phone quitline, such as 1-800-QUIT-NOW, reach out to support groups, or work with a counselor. Ask people who smoke to not smoke around you. Avoid places that make you want to smoke, such as: Bars. Parties. Smoke-break areas at work. Spend time with people who do not smoke. Lower   the stress in your life. Stress can make you want to smoke. Try these things to lower stress: Getting regular exercise. Doing deep-breathing exercises. Doing yoga. Meditating. What benefits will I see if I quit smoking? Over time, you may have: A better sense of smell and taste. Less coughing and sore throat. A slower heart rate. Lower blood pressure. Clearer skin. Better breathing. Fewer sick days. Summary Quitting smoking can be hard, but it is one of  the best things that you can do for your health. Do not give up if you cannot quit the first time. Some people need to try many times to quit. When you decide to quit smoking, make a plan to help you succeed. Quit smoking right away, not slowly over a period of time. When you start quitting, get help and support to keep you smoke-free. This information is not intended to replace advice given to you by your health care provider. Make sure you discuss any questions you have with your health care provider. Document Revised: 12/14/2020 Document Reviewed: 12/14/2020 Elsevier Patient Education  2023 Elsevier Inc.  

## 2021-08-26 NOTE — Progress Notes (Signed)
[facility] [place], [place] [address]  Pulmonary Sleep Medicine   Office Visit Note  Patient Name: Chase Burnett DOB: 1955-06-14 MRN [id number]  Date of Service: 08/26/2021  Complaints/HPI: He has been doing OK other than the excessive heat. States he is getting winded due to the hot weather. He is on trelegy and also is on albuterol. States both inhalers seem to help him much better than the prior inhalers. Occasional cough and some SOB as noted. Also notes usual congestion nothing out of ordinary  ROS  General: (-) fever, (-) chills, (-) night sweats, (-) weakness Skin: (-) rashes, (-) itching,. Eyes: (-) visual changes, (-) redness, (-) itching. Nose and Sinuses: (-) nasal stuffiness or itchiness, (-) postnasal drip, (-) nosebleeds, (-) sinus trouble. Mouth and Throat: (-) sore throat, (-) hoarseness. Neck: (-) swollen glands, (-) enlarged thyroid, (-) neck pain. Respiratory: + cough, (-) bloody sputum, + shortness of breath, - wheezing. Cardiovascular: - ankle swelling, (-) chest pain. Lymphatic: (-) lymph node enlargement. Neurologic: (-) numbness, (-) tingling. Psychiatric: (-) anxiety, (-) depression   Current Medication: Outpatient Encounter Medications as of 08/26/2021  Medication Sig   albuterol (VENTOLIN HFA) 108 (90 Base) MCG/ACT inhaler INHALE 2 PUFFS INTO THE LUNGS EVERY 6 HOURS AS NEEDED FOR WHEEZING OR SHORTNESS OF BREATH   atorvastatin (LIPITOR) 10 MG tablet Take 1 tablet (10 mg total) by mouth daily.   clonazePAM (KLONOPIN) 0.5 MG tablet Take 1/2-1 tablet by mouth daily as needed   Fluticasone-Umeclidin-Vilant (TRELEGY ELLIPTA) 100-62.5-25 MCG/ACT AEPB Inhale 1 puff into the lungs daily.   hydrOXYzine (VISTARIL) 25 MG capsule Take 1 capsule (25 mg total) by mouth every 8 (eight) hours as needed.   traZODone (DESYREL) 50 MG tablet TAKE 1 TABLET BY MOUTH AT BEDTIME FOR SLEEP   No facility-administered encounter medications on file  as of 08/26/2021.    Surgical History: Past Surgical History:  Procedure Laterality Date   CHEST TUBE INSERTION      Medical History: Past Medical History:  Diagnosis Date   COPD (chronic obstructive pulmonary disease) ([place])    Hyperlipidemia    Pneumothorax     Family History: Family History  Problem Relation Age of Onset   Cancer Mother    Anxiety disorder Brother    COPD Brother     Social History: Social History   Socioeconomic History   Marital status: Married    Spouse name: Not on file   Number of children: Not on file   Years of education: Not on file   Highest education level: Not on file  Occupational History   Not on file  Tobacco Use   Smoking status: Every Day    Packs/day: 0.50    Years: 40.00    Total pack years: 20.00    Types: Cigarettes   Smokeless tobacco: Never   Tobacco comments:    Pt down to 1/2 pk or less daily 02/14/21  Substance and Sexual Activity   Alcohol use: Not Currently   Drug use: Never   Sexual activity: Not on file  Other Topics Concern   Not on file  Social History Narrative   Not on file   Social Determinants of Health   Financial Resource Strain: Not on file  Food Insecurity: Not on file  Transportation Needs: Not on file  Physical Activity: Not on file  Stress: Not on file  Social Connections: Not on file  Intimate Partner Violence: Not on file  Vital Signs: Blood pressure 132/73, pulse 94, temperature 98.3 F (36.8 C), resp. rate 16, height _0  (1.753 m), weight 136 lb (61.7 kg), SpO2 97 %.  Examination: General Appearance: The patient is well-developed, well-nourished, and in no distress. Skin: Gross inspection of skin unremarkable. Head: normocephalic, no gross deformities. Eyes: no gross deformities noted. ENT: ears appear grossly normal no exudates. Neck: Supple. No thyromegaly. No LAD. Respiratory: few rhonchi noted. Cardiovascular: Normal S1 and S2 without murmur or rub. Extremities: No  cyanosis. pulses are equal. Neurologic: Alert and oriented. No involuntary movements.  LABS: Recent Results (from the past 2160 hour(s))  Pulmonary Function Test     Status: None   Collection Time: 05/30/21  1:31 PM  Result Value Ref Range   FEV1     FVC     FEV1/FVC     TLC     DLCO    CBC w/Diff/Platelet     Status: Abnormal   Collection Time: 06/07/21 10:32 AM  Result Value Ref Range   WBC 4.8 3.4 - 10.8 x10E3/uL   RBC 4.41 4.14 - 5.80 x10E6/uL   Hemoglobin 15.0 13.0 - 17.7 g/dL   Hematocrit 42.5 37.5 - 51.0 %   MCV 96 79 - 97 fL   MCH 34.0 (H) 26.6 - 33.0 pg   MCHC 35.3 31.5 - 35.7 g/dL   RDW 12.1 11.6 - 15.4 %   Platelets 235 150 - 450 x10E3/uL   Neutrophils 53 Not Estab. %   Lymphs 32 Not Estab. %   Monocytes 11 Not Estab. %   Eos 3 Not Estab. %   Basos 1 Not Estab. %   Neutrophils Absolute 2.6 1.4 - 7.0 x10E3/uL   Lymphocytes Absolute 1.5 0.7 - 3.1 x10E3/uL   Monocytes Absolute 0.5 0.1 - 0.9 x10E3/uL   EOS (ABSOLUTE) 0.2 0.0 - 0.4 x10E3/uL   Basophils Absolute 0.1 0.0 - 0.2 x10E3/uL   Immature Granulocytes 0 Not Estab. %   Immature Grans (Abs) 0.0 0.0 - 0.1 x10E3/uL  Comprehensive metabolic panel     Status: Abnormal   Collection Time: 06/07/21 10:32 AM  Result Value Ref Range   Glucose 96 70 - 99 mg/dL   BUN 15 8 - 27 mg/dL   Creatinine, Ser 0.73 (L) 0.76 - 1.27 mg/dL   eGFR 101 >59 mL/min/1.73   BUN/Creatinine Ratio 21 10 - 24   Sodium 138 134 - 144 mmol/L   Potassium 5.0 3.5 - 5.2 mmol/L   Chloride 101 96 - 106 mmol/L   CO2 23 20 - 29 mmol/L   Calcium 9.9 8.6 - 10.2 mg/dL   Total Protein 6.8 6.0 - 8.5 g/dL   Albumin 4.5 3.8 - 4.8 g/dL   Globulin, Total 2.3 1.5 - 4.5 g/dL   Albumin/Globulin Ratio 2.0 1.2 - 2.2   Bilirubin Total 0.5 0.0 - 1.2 mg/dL   Alkaline Phosphatase 105 44 - 121 IU/L   AST 16 0 - 40 IU/L   ALT 14 0 - 44 IU/L  Lipid Panel With LDL/HDL Ratio     Status: None   Collection Time: 06/07/21 10:32 AM  Result Value Ref Range    Cholesterol, Total 143 100 - 199 mg/dL   Triglycerides 48 0 - 149 mg/dL   HDL 67 >39 mg/dL   VLDL Cholesterol Cal 11 5 - 40 mg/dL   LDL Chol Calc (NIH) 65 0 - 99 mg/dL   LDL/HDL Ratio 1.0 0.0 - 3.6 ratio    Comment:  LDL/HDL Ratio                                             Men  Women                               1/2 Avg.Risk  1.0    1.5                                   Avg.Risk  3.6    3.2                                2X Avg.Risk  6.2    5.0                                3X Avg.Risk  8.0    6.1   TSH + free T4     Status: None   Collection Time: 06/07/21 10:32 AM  Result Value Ref Range   TSH 1.910 0.450 - 4.500 uIU/mL   Free T4 1.53 0.82 - 1.77 ng/dL  PSA Total (Reflex To Free)     Status: None   Collection Time: 06/07/21 10:32 AM  Result Value Ref Range   Prostate Specific Ag, Serum 0.4 0.0 - 4.0 ng/mL    Comment: Roche ECLIA methodology. According to the American Urological Association, Serum PSA should decrease and remain at undetectable levels after radical prostatectomy. The AUA defines biochemical recurrence as an initial PSA value 0.2 ng/mL or greater followed by a subsequent confirmatory PSA value 0.2 ng/mL or greater. Values obtained with different assay methods or kits cannot be used interchangeably. Results cannot be interpreted as absolute evidence of the presence or absence of malignant disease.    Reflex Criteria Comment     Comment: The percent free PSA is performed on a reflex basis only when the total PSA is between 4.0 and 10.0 ng/mL.     Radiology: CT CHEST WO CONTRAST  Result Date: 05/24/2017 CLINICAL DATA:  Productive cough on antibiotics four days. EXAM: CT CHEST WITHOUT CONTRAST TECHNIQUE: Multidetector CT imaging of the chest was performed following the standard protocol without IV contrast. COMPARISON:  Chest x-ray 05/23/2017 FINDINGS: Cardiovascular: Heart is normal size. Calcified plaque over the left main  and 3 vessel coronary arteries. Minimal calcified plaque over the thoracic aorta. Mediastinum/Nodes: No significant hilar or mediastinal adenopathy. Remaining mediastinal structures are unremarkable. Lungs/Pleura: Lungs are adequately inflated demonstrate moderate centrilobular emphysema. There is a patchy bilateral reticulonodular pattern of opacification. No evidence of effusion. Airways are within normal. Upper Abdomen: No acute abnormality. Mild calcified plaque over the abdominal aorta. Nonspecific 1 cm right retrocrural lymph node. Musculoskeletal: Mild degenerate changes spine with several Schmorl's nodes. IMPRESSION: Bilateral patchy reticulonodular pattern of opacification. Findings are likely due to an infectious or atypical infectious process such as mycobacterium avium complex or an inflammatory process. Recommend follow-up CT 4-6 weeks. Atherosclerotic coronary artery disease. Aortic Atherosclerosis (ICD10-I70.0). Electronically Signed   By: Marin Olp M.D.   On: 05/24/2017 14:03   DG Chest 2 View  Result Date: 05/23/2017 CLINICAL DATA:  Shortness of Breath EXAM:  CHEST - 2 VIEW COMPARISON:  07/01/2004 FINDINGS: Coarsened interstitial opacities throughout the lungs, likely severe chronic interstitial lung disease. Hyperinflation/COPD. Nodular areas in both mid lungs may represent a component of the interstitial disease but I cannot exclude other pulmonary nodules. No effusions. Heart is normal size. IMPRESSION: Chronic interstitial lung disease, COPD. Nodular densities in both mid lungs. These could be further evaluated with noncontrast chest CT. Electronically Signed   By: Rolm Baptise M.D.   On: 05/23/2017 22:26    No results found.  No results found.    Assessment and Plan: Patient Active Problem List   Diagnosis Date Noted   Chronic obstructive pulmonary disease ([place]) 01/11/2020   Dyspnea on exertion 01/11/2020   Screening for lung cancer 01/11/2020   Moderate cigarette smoker  01/11/2020   Coronary artery disease involving native coronary artery of native heart without angina pectoris 01/11/2020   Aortic atherosclerosis ([place]) 01/11/2020   Generalized anxiety disorder 01/11/2020   Other fatigue 01/11/2020   Screening for colon cancer 01/11/2020   Respiratory failure with hypoxia ([place]) 05/24/2017    1. SOB (shortness of breath) Seems to be at baseline has noted worsening with the heat but is ok in the home - Spirometry with Graph  2. Obstructive chronic bronchitis without exacerbation (HCC) On trelegy and albuterol will get renewals as needed  3. Cigarette nicotine dependence with nicotine-induced disorder STOP SMOKING counseling provided today   General Counseling: I have discussed the findings of the evaluation and examination with Annie Main.  I have also discussed any further diagnostic evaluation thatmay be needed or ordered today. Traver verbalizes understanding of the findings of todays visit. We also reviewed his medications today and discussed drug interactions and side effects including but not limited excessive drowsiness and altered mental states. We also discussed that there is always a risk not just to him but also people around him. he has been encouraged to call the office with any questions or concerns that should arise related to todays visit.  Orders Placed This Encounter  Procedures   Spirometry with Graph    Order Specific Question:   Where should this test be performed?    Answer:   [facility]    Order Specific Question:   Basic spirometry    Answer:   Yes    Order Specific Question:   Spirometry pre & post bronchodilator    Answer:   Yes     Time spent: 65  I have personally obtained a history, examined the patient, evaluated laboratory and imaging results, formulated the assessment and plan and placed orders.    Allyne Gee, MD [facility] Pulmonary and Critical Care Sleep medicine

## 2021-09-26 ENCOUNTER — Ambulatory Visit (INDEPENDENT_AMBULATORY_CARE_PROVIDER_SITE_OTHER): Payer: Self-pay | Admitting: Physician Assistant

## 2021-09-26 ENCOUNTER — Encounter: Payer: Self-pay | Admitting: Physician Assistant

## 2021-09-26 VITALS — BP 130/80 | HR 80 | Temp 97.8°F | Resp 16 | Ht 69.0 in | Wt 140.0 lb

## 2021-09-26 DIAGNOSIS — F411 Generalized anxiety disorder: Secondary | ICD-10-CM | POA: Diagnosis not present

## 2021-09-26 DIAGNOSIS — J449 Chronic obstructive pulmonary disease, unspecified: Secondary | ICD-10-CM

## 2021-09-26 DIAGNOSIS — F17219 Nicotine dependence, cigarettes, with unspecified nicotine-induced disorders: Secondary | ICD-10-CM

## 2021-09-26 MED ORDER — ESCITALOPRAM OXALATE 10 MG PO TABS: 10.0000 mg | ORAL_TABLET | Freq: Every day | ORAL | 2 refills | Status: DC

## 2021-09-26 MED ORDER — CLONAZEPAM 0.5 MG PO TABS: ORAL_TABLET | ORAL | 1 refills | Status: DC

## 2021-09-26 NOTE — Progress Notes (Signed)
[facility] [address] [place], [place] [address]  Internal MEDICINE  Office Visit Note  Patient Name: Chase Burnett  [id number]  [id number]  Date of Service: 10/01/2021  Chief Complaint  Patient presents with   Follow-up   Hyperlipidemia   COPD    HPI Pt is here for routine follow up -About to go on a fishing trip -breathing has been ok -Taking the 0.5mg  klonopin, takes most afternoons. He is by himself. Taking the medication helps, but the 1/2 tablet doesn't seem to work as well and has taken full tablet at times. He often doesn't leave the house if feeling anxious. -Discussed trying daily anxiety medication and using the klonopin only as needed. Denies panic attacks. -still taking trazodone at night -He is due for CPE at end of the year  Current Medication: Outpatient Encounter Medications as of 09/26/2021  Medication Sig   albuterol (VENTOLIN HFA) 108 (90 Base) MCG/ACT inhaler INHALE 2 PUFFS INTO THE LUNGS EVERY 6 HOURS AS NEEDED FOR WHEEZING OR SHORTNESS OF BREATH   atorvastatin (LIPITOR) 10 MG tablet Take 1 tablet (10 mg total) by mouth daily.   escitalopram (LEXAPRO) 10 MG tablet Take 1 tablet (10 mg total) by mouth daily.   Fluticasone-Umeclidin-Vilant (TRELEGY ELLIPTA) 100-62.5-25 MCG/ACT AEPB Inhale 1 puff into the lungs daily.   traZODone (DESYREL) 50 MG tablet TAKE 1 TABLET BY MOUTH AT BEDTIME FOR SLEEP   [DISCONTINUED] clonazePAM (KLONOPIN) 0.5 MG tablet Take 1/2-1 tablet by mouth daily as needed   [DISCONTINUED] hydrOXYzine (VISTARIL) 25 MG capsule Take 1 capsule (25 mg total) by mouth every 8 (eight) hours as needed.   clonazePAM (KLONOPIN) 0.5 MG tablet Take 1/2-1 tablet by mouth daily as needed   [DISCONTINUED] clonazePAM (KLONOPIN) 0.5 MG tablet Take 1/2-1 tablet by mouth daily as needed   No facility-administered encounter medications on file as of 09/26/2021.    Surgical History: Past Surgical History:  Procedure Laterality Date    CHEST TUBE INSERTION      Medical History: Past Medical History:  Diagnosis Date   COPD (chronic obstructive pulmonary disease) (HCC)    Hyperlipidemia    Pneumothorax     Family History: Family History  Problem Relation Age of Onset   Cancer Mother    Anxiety disorder Brother    COPD Brother     Social History   Socioeconomic History   Marital status: Married    Spouse name: Not on file   Number of children: Not on file   Years of education: Not on file   Highest education level: Not on file  Occupational History   Not on file  Tobacco Use   Smoking status: Every Day    Packs/day: 0.50    Years: 40.00    Total pack years: 20.00    Types: Cigarettes   Smokeless tobacco: Never   Tobacco comments:    Pt down to 1/2 pk or less daily 02/14/21  Substance and Sexual Activity   Alcohol use: Not Currently   Drug use: Never   Sexual activity: Not on file  Other Topics Concern   Not on file  Social History Narrative   Not on file   Social Determinants of Health   Financial Resource Strain: Not on file  Food Insecurity: Not on file  Transportation Needs: Not on file  Physical Activity: Not on file  Stress: Not on file  Social Connections: Not on file  Intimate Partner Violence: Not on file  Review of Systems  Constitutional:  Negative for chills, fatigue and unexpected weight change.  HENT:  Negative for congestion, postnasal drip, rhinorrhea, sneezing and sore throat.   Eyes:  Negative for redness.  Respiratory:  Negative for cough, chest tightness, shortness of breath and wheezing.   Cardiovascular:  Negative for chest pain and palpitations.  Gastrointestinal:  Negative for abdominal pain, constipation, diarrhea, nausea and vomiting.  Genitourinary:  Negative for dysuria and frequency.  Musculoskeletal:  Negative for arthralgias, back pain, joint swelling and neck pain.  Skin:  Negative for rash.  Neurological: Negative.  Negative for tremors and  numbness.  Hematological:  Negative for adenopathy. Does not bruise/bleed easily.  Psychiatric/Behavioral:  Positive for sleep disturbance. Negative for behavioral problems (Depression) and suicidal ideas. The patient is nervous/anxious.     Vital Signs: BP 130/80   Pulse 80   Temp 97.8 F (36.6 C)   Resp 16   Ht 5\' 9"  (1.753 m)   Wt 140 lb (63.5 kg)   SpO2 97%   BMI 20.67 kg/m    Physical Exam Vitals and nursing note reviewed.  Constitutional:      General: He is not in acute distress.    Appearance: He is well-developed and normal weight. He is not diaphoretic.  HENT:     Head: Normocephalic and atraumatic.     Mouth/Throat:     Pharynx: No oropharyngeal exudate.  Eyes:     Pupils: Pupils are equal, round, and reactive to light.  Neck:     Thyroid: No thyromegaly.     Vascular: No JVD.     Trachea: No tracheal deviation.  Cardiovascular:     Rate and Rhythm: Normal rate and regular rhythm.     Heart sounds: Normal heart sounds. No murmur heard.    No friction rub. No gallop.  Pulmonary:     Effort: Pulmonary effort is normal. No respiratory distress.     Breath sounds: Wheezing present. No rales.  Chest:     Chest wall: No tenderness.  Abdominal:     General: Bowel sounds are normal.     Palpations: Abdomen is soft.  Musculoskeletal:        General: Normal range of motion.     Cervical back: Normal range of motion and neck supple.     Right lower leg: No edema.     Left lower leg: No edema.  Lymphadenopathy:     Cervical: No cervical adenopathy.  Skin:    General: Skin is warm and dry.  Neurological:     Mental Status: He is alert and oriented to person, place, and time.     Cranial Nerves: No cranial nerve deficit.  Psychiatric:        Behavior: Behavior normal.        Thought Content: Thought content normal.        Judgment: Judgment normal.        Assessment/Plan: 1. Generalized anxiety disorder Will start on lexapro daily and may use 1/2-1  tablet of klonopin as needed. May need ot titrate up on Lexapro in future - escitalopram (LEXAPRO) 10 MG tablet; Take 1 tablet (10 mg total) by mouth daily.  Dispense: 30 tablet; Refill: 2 - clonazePAM (KLONOPIN) 0.5 MG tablet; Take 1/2-1 tablet by mouth daily as needed  Dispense: 30 tablet; Refill: 1  2. Obstructive chronic bronchitis without exacerbation (HCC) Continue inhalers as prescribed, followed by pulm  3. Cigarette nicotine dependence with nicotine-induced disorder Continue to work on  smoking cessation   General Counseling: franciscojavier wronski understanding of the findings of todays visit and agrees with plan of treatment. I have discussed any further diagnostic evaluation that may be needed or ordered today. We also reviewed his medications today. he has been encouraged to call the office with any questions or concerns that should arise related to todays visit.    No orders of the defined types were placed in this encounter.   Meds ordered this encounter  Medications   escitalopram (LEXAPRO) 10 MG tablet    Sig: Take 1 tablet (10 mg total) by mouth daily.    Dispense:  30 tablet    Refill:  2   DISCONTD: clonazePAM (KLONOPIN) 0.5 MG tablet    Sig: Take 1/2-1 tablet by mouth daily as needed    Dispense:  30 tablet    Refill:  1   clonazePAM (KLONOPIN) 0.5 MG tablet    Sig: Take 1/2-1 tablet by mouth daily as needed    Dispense:  30 tablet    Refill:  1    This patient was seen by Drema Dallas, PA-C in collaboration with Dr. Clayborn Bigness as a part of collaborative care agreement.   Total time spent:30 Minutes Time spent includes review of chart, medications, test results, and follow up plan with the patient.      Dr Lavera Guise Internal medicine

## 2021-09-27 ENCOUNTER — Telehealth: Payer: Self-pay

## 2021-09-27 MED ORDER — CLONAZEPAM 0.5 MG PO TABS: ORAL_TABLET | ORAL | 1 refills | Status: DC

## 2021-09-27 NOTE — Telephone Encounter (Signed)
Pt called that [facility] is out with clonazepam  so lauren send to [facility]

## 2021-10-30 ENCOUNTER — Other Ambulatory Visit: Payer: Self-pay | Admitting: Physician Assistant

## 2021-10-30 DIAGNOSIS — J449 Chronic obstructive pulmonary disease, unspecified: Secondary | ICD-10-CM

## 2022-01-02 ENCOUNTER — Other Ambulatory Visit: Payer: Self-pay | Admitting: Nurse Practitioner

## 2022-01-02 DIAGNOSIS — F411 Generalized anxiety disorder: Secondary | ICD-10-CM

## 2022-01-07 ENCOUNTER — Other Ambulatory Visit: Payer: Self-pay | Admitting: Physician Assistant

## 2022-01-07 DIAGNOSIS — F411 Generalized anxiety disorder: Secondary | ICD-10-CM

## 2022-01-10 ENCOUNTER — Ambulatory Visit (INDEPENDENT_AMBULATORY_CARE_PROVIDER_SITE_OTHER): Payer: PPO | Admitting: Physician Assistant

## 2022-01-10 ENCOUNTER — Encounter: Payer: Self-pay | Admitting: Physician Assistant

## 2022-01-10 VITALS — BP 141/77 | HR 85 | Temp 97.8°F | Resp 16 | Ht 69.0 in | Wt 135.6 lb

## 2022-01-10 DIAGNOSIS — F17219 Nicotine dependence, cigarettes, with unspecified nicotine-induced disorders: Secondary | ICD-10-CM | POA: Diagnosis not present

## 2022-01-10 DIAGNOSIS — I700 Atherosclerosis of aorta: Secondary | ICD-10-CM | POA: Diagnosis not present

## 2022-01-10 DIAGNOSIS — Z0001 Encounter for general adult medical examination with abnormal findings: Secondary | ICD-10-CM | POA: Diagnosis not present

## 2022-01-10 DIAGNOSIS — R300 Dysuria: Secondary | ICD-10-CM

## 2022-01-10 DIAGNOSIS — F411 Generalized anxiety disorder: Secondary | ICD-10-CM

## 2022-01-10 MED ORDER — TRAZODONE HCL 50 MG PO TABS: ORAL_TABLET | ORAL | 1 refills | Status: DC

## 2022-01-10 MED ORDER — TETANUS-DIPHTH-ACELL PERTUSSIS 5-2-15.5 LF-MCG/0.5 IM SUSP: 0.5000 mL | Freq: Once | INTRAMUSCULAR | 0 refills | Status: AC

## 2022-01-10 MED ORDER — SHINGRIX 50 MCG/0.5ML IM SUSR: 0.5000 mL | Freq: Once | INTRAMUSCULAR | 0 refills | Status: AC

## 2022-01-10 NOTE — Progress Notes (Signed)
[facility] [place], [place] [address]  Internal MEDICINE  Office Visit Note  Patient Name: Chase Burnett  [id number]  [id number]  Date of Service: 01/10/2022  Chief Complaint  Patient presents with   Medicare Wellness   Hyperlipidemia   Quality Metric Gaps    Shingles and TDAP     HPI Pt is here for routine health maintenance examination -right middle finger has been numb along tip for a little while, it is getting better now. May need to check B12 with routine labs when due. Possible he knocked it on something and doesn't recall, but is improving. -not taking lexapro, never took it, did pick it up, but felt things were better and wants to avoid more meds. Only taking klonopin maybe once every two weeks now therefore really only prn instead of daily need. -Sleeping well with trazodone nightly -did stop his cholesterol medication because he really changed his diet and his labs had imrpoved last time so didn't think he needed it. Discussed he has mild plaue of carotids and aortic atherosclerosis and statin helps lower CV risk therefore may restart a few days per week for CV risk reduction -Smoking a little over 1/2 ppd, due for lung cancer screening and states he will discuss ct screen at pulm visit next month -due for shingles and tdap -Bp borderline likely due to anxiety in office and not realizing he had his wellness visit not pulm visit today and felt unprepared with his questions   Current Medication: Outpatient Encounter Medications as of 01/10/2022  Medication Sig   albuterol (VENTOLIN HFA) 108 (90 Base) MCG/ACT inhaler INHALE 2 PUFFS INTO THE LUNGS EVERY 6 HOURS AS NEEDED FOR WHEEZING OR SHORTNESS OF BREATH   clonazePAM (KLONOPIN) 0.5 MG tablet Take 1/2-1 tablet by mouth daily as needed   TRELEGY ELLIPTA 100-62.5-25 MCG/ACT AEPB INHALE 1 PUFF INTO THE LUNGS DAILY   [DISCONTINUED] atorvastatin (LIPITOR) 10 MG tablet Take 1 tablet (10 mg total) by  mouth daily.   [DISCONTINUED] escitalopram (LEXAPRO) 10 MG tablet TAKE 1 TABLET(10 MG) BY MOUTH DAILY   [DISCONTINUED] Tdap (ADACEL) 05-07-13.5 LF-MCG/0.5 injection Inject 0.5 mLs into the muscle once.   [DISCONTINUED] traZODone (DESYREL) 50 MG tablet TAKE 1 TABLET BY MOUTH AT BEDTIME FOR SLEEP   [DISCONTINUED] Zoster Vaccine Adjuvanted [facility]) injection Inject 0.5 mLs into the muscle once.   Tdap (ADACEL) 05-07-13.5 LF-MCG/0.5 injection Inject 0.5 mLs into the muscle once for 1 dose.   traZODone (DESYREL) 50 MG tablet Take 1 tablet by mouth nightly before bed   Zoster Vaccine Adjuvanted [facility]) injection Inject 0.5 mLs into the muscle once for 1 dose.   No facility-administered encounter medications on file as of 01/10/2022.    Surgical History: Past Surgical History:  Procedure Laterality Date   CHEST TUBE INSERTION      Medical History: Past Medical History:  Diagnosis Date   COPD (chronic obstructive pulmonary disease) ([place])    Hyperlipidemia    Pneumothorax     Family History: Family History  Problem Relation Age of Onset   Cancer Mother    Anxiety disorder Brother    COPD Brother       Review of Systems  Constitutional:  Negative for chills, fatigue and unexpected weight change.  HENT:  Negative for congestion, postnasal drip, rhinorrhea, sneezing and sore throat.   Eyes:  Negative for redness.  Respiratory:  Negative for cough, chest tightness, shortness of breath and wheezing.   Cardiovascular:  Negative  for chest pain and palpitations.  Gastrointestinal:  Negative for abdominal pain, constipation, diarrhea, nausea and vomiting.  Genitourinary:  Negative for dysuria and frequency.  Musculoskeletal:  Negative for arthralgias, back pain, joint swelling and neck pain.  Skin:  Negative for rash.  Neurological:  Positive for numbness. Negative for tremors.  Hematological:  Negative for adenopathy. Does not bruise/bleed easily.  Psychiatric/Behavioral:  Negative for  behavioral problems (Depression) and suicidal ideas. The patient is nervous/anxious.      Vital Signs: BP (!) 141/77   Pulse 85   Temp 97.8 F (36.6 C)   Resp 16   Ht 5\' 9"  (1.753 m)   Wt 135 lb 9.6 oz (61.5 kg)   SpO2 97%   BMI 20.02 kg/m    Physical Exam Vitals and nursing note reviewed.  Constitutional:      General: He is not in acute distress.    Appearance: Normal appearance. He is well-developed. He is not diaphoretic.  HENT:     Head: Normocephalic and atraumatic.     Mouth/Throat:     Pharynx: No oropharyngeal exudate.  Eyes:     Pupils: Pupils are equal, round, and reactive to light.  Neck:     Thyroid: No thyromegaly.     Vascular: No JVD.     Trachea: No tracheal deviation.  Cardiovascular:     Rate and Rhythm: Normal rate and regular rhythm.     Heart sounds: Normal heart sounds. No murmur heard.    No friction rub. No gallop.  Pulmonary:     Effort: Pulmonary effort is normal. No respiratory distress.     Breath sounds: No wheezing or rales.  Chest:     Chest wall: No tenderness.  Abdominal:     General: Bowel sounds are normal.     Palpations: Abdomen is soft.     Tenderness: There is no abdominal tenderness.  Musculoskeletal:        General: Normal range of motion.     Cervical back: Normal range of motion and neck supple.  Lymphadenopathy:     Cervical: No cervical adenopathy.  Skin:    General: Skin is warm and dry.  Neurological:     Mental Status: He is alert and oriented to person, place, and time.     Cranial Nerves: No cranial nerve deficit.  Psychiatric:        Behavior: Behavior normal.        Thought Content: Thought content normal.        Judgment: Judgment normal.      LABS: No results found for this or any previous visit (from the past 2160 hour(s)).     Assessment/Plan: 1. Encounter for general adult medical examination with abnormal findings Cpe performed, not yet due for labs, due for shingles and tdap as well as  CT lung screening  2. Generalized anxiety disorder May continue klonopin prn and trazodone before bed - traZODone (DESYREL) 50 MG tablet; Take 1 tablet by mouth nightly before bed  Dispense: 90 tablet; Refill: 1  3. Aortic atherosclerosis ([place]) May restart lipitor a few days per week for CV risk reduction and will monitor labs when due  4. Cigarette nicotine dependence with nicotine-induced disorder Continue to work on smoking cessation. Will discuss lung cancer screening CT with pulmonology at visit next month  5. Dysuria - UA/M w/rflx Culture, Routine   General Counseling: achillies buehl understanding of the findings of todays visit and agrees with plan of treatment. I have  discussed any further diagnostic evaluation that may be needed or ordered today. We also reviewed his medications today. he has been encouraged to call the office with any questions or concerns that should arise related to todays visit.    Counseling:    Orders Placed This Encounter  Procedures   UA/M w/rflx Culture, Routine    Meds ordered this encounter  Medications   traZODone (DESYREL) 50 MG tablet    Sig: Take 1 tablet by mouth nightly before bed    Dispense:  90 tablet    Refill:  1   Tdap (ADACEL) 05-07-13.5 LF-MCG/0.5 injection    Sig: Inject 0.5 mLs into the muscle once for 1 dose.    Dispense:  0.5 mL    Refill:  0   Zoster Vaccine Adjuvanted [facility]) injection    Sig: Inject 0.5 mLs into the muscle once for 1 dose.    Dispense:  0.5 mL    Refill:  0    This patient was seen by Lynn Ito, PA-C in collaboration with Dr. Beverely Risen as a part of collaborative care agreement.  Total time spent:35 Minutes  Time spent includes review of chart, medications, test results, and follow up plan with the patient.     Lyndon Code, MD  Internal Medicine

## 2022-01-11 LAB — MICROSCOPIC EXAMINATION
Bacteria, UA: NONE SEEN
Casts: NONE SEEN /lpf
Epithelial Cells (non renal): NONE SEEN /hpf (ref 0–10)

## 2022-01-11 LAB — UA/M W/RFLX CULTURE, ROUTINE
Bilirubin, UA: NEGATIVE
Glucose, UA: NEGATIVE
Ketones, UA: NEGATIVE
Leukocytes,UA: NEGATIVE
Nitrite, UA: NEGATIVE
Protein,UA: NEGATIVE
RBC, UA: NEGATIVE
Specific Gravity, UA: 1.014 (ref 1.005–1.030)
Urobilinogen, Ur: 0.2 mg/dL (ref 0.2–1.0)
pH, UA: 7 (ref 5.0–7.5)

## 2022-02-24 ENCOUNTER — Ambulatory Visit (INDEPENDENT_AMBULATORY_CARE_PROVIDER_SITE_OTHER): Payer: PPO | Admitting: Nurse Practitioner

## 2022-02-24 ENCOUNTER — Encounter: Payer: Self-pay | Admitting: Nurse Practitioner

## 2022-02-24 VITALS — BP 135/75 | HR 78 | Temp 98.2°F | Resp 16 | Ht 69.0 in | Wt 135.0 lb

## 2022-02-24 DIAGNOSIS — F17210 Nicotine dependence, cigarettes, uncomplicated: Secondary | ICD-10-CM | POA: Diagnosis not present

## 2022-02-24 DIAGNOSIS — J209 Acute bronchitis, unspecified: Secondary | ICD-10-CM

## 2022-02-24 DIAGNOSIS — J440 Chronic obstructive pulmonary disease with acute lower respiratory infection: Secondary | ICD-10-CM | POA: Diagnosis not present

## 2022-02-24 DIAGNOSIS — J449 Chronic obstructive pulmonary disease, unspecified: Secondary | ICD-10-CM

## 2022-02-24 MED ORDER — ALBUTEROL SULFATE HFA 108 (90 BASE) MCG/ACT IN AERS: 2.0000 | INHALATION_SPRAY | Freq: Four times a day (QID) | RESPIRATORY_TRACT | 2 refills | Status: DC | PRN

## 2022-02-24 MED ORDER — AZITHROMYCIN 250 MG PO TABS: ORAL_TABLET | ORAL | 0 refills | Status: AC

## 2022-02-24 MED ORDER — PREDNISONE 10 MG (21) PO TBPK: ORAL_TABLET | ORAL | 0 refills | Status: DC

## 2022-02-24 MED ORDER — TRELEGY ELLIPTA 100-62.5-25 MCG/ACT IN AEPB: 1.0000 | INHALATION_SPRAY | Freq: Every day | RESPIRATORY_TRACT | 3 refills | Status: DC

## 2022-02-24 NOTE — Progress Notes (Signed)
[facility] [place], [place] [address]  Internal MEDICINE  Office Visit Note  Patient Name: Chase Burnett  [id number]  [id number]  Date of Service: 02/24/2022  Chief Complaint  Patient presents with   Follow-up    HPI Chase Burnett presents for a follow-up visit for COPD COPD -- possibly has bronchitis -- trelegy daily working well, using rescue inhaler once or twice a day lately but was doing better before then.  Hypertension -- increased BP, anxiety and stress. Improved when rechecked.  Poss bronchitis -- green drainage,cough, wheezing, sob Current smoker -- down to 1/2 ppd, has been working on quitting.     Current Medication: Outpatient Encounter Medications as of 02/24/2022  Medication Sig   azithromycin (ZITHROMAX) 250 MG tablet Take 2 tablets on day 1, then 1 tablet daily on days 2 through 5   clonazePAM (KLONOPIN) 0.5 MG tablet Take 1/2-1 tablet by mouth daily as needed   predniSONE (STERAPRED UNI-PAK 21 TAB) 10 MG (21) TBPK tablet Use as directed for 6 days   traZODone (DESYREL) 50 MG tablet Take 1 tablet by mouth nightly before bed   [DISCONTINUED] albuterol (VENTOLIN HFA) 108 (90 Base) MCG/ACT inhaler INHALE 2 PUFFS INTO THE LUNGS EVERY 6 HOURS AS NEEDED FOR WHEEZING OR SHORTNESS OF BREATH   [DISCONTINUED] TRELEGY ELLIPTA 100-62.5-25 MCG/ACT AEPB INHALE 1 PUFF INTO THE LUNGS DAILY   albuterol (VENTOLIN HFA) 108 (90 Base) MCG/ACT inhaler Inhale 2 puffs into the lungs every 6 (six) hours as needed for wheezing or shortness of breath.   Fluticasone-Umeclidin-Vilant (TRELEGY ELLIPTA) 100-62.5-25 MCG/ACT AEPB Inhale 1 puff into the lungs daily.   No facility-administered encounter medications on file as of 02/24/2022.    Surgical History: Past Surgical History:  Procedure Laterality Date   CHEST TUBE INSERTION      Medical History: Past Medical History:  Diagnosis Date   COPD (chronic obstructive pulmonary disease) ([place])    Hyperlipidemia     Pneumothorax     Family History: Family History  Problem Relation Age of Onset   Cancer Mother    Anxiety disorder Brother    COPD Brother     Social History   Socioeconomic History   Marital status: Married    Spouse name: Not on file   Number of children: Not on file   Years of education: Not on file   Highest education level: Not on file  Occupational History   Not on file  Tobacco Use   Smoking status: Every Day    Packs/day: 0.50    Years: 40.00    Total pack years: 20.00    Types: Cigarettes   Smokeless tobacco: Never   Tobacco comments:    Pt down to 1/2 pk or less daily   Substance and Sexual Activity   Alcohol use: Not Currently   Drug use: Never   Sexual activity: Not on file  Other Topics Concern   Not on file  Social History Narrative   Not on file   Social Determinants of Health   Financial Resource Strain: Not on file  Food Insecurity: Not on file  Transportation Needs: Not on file  Physical Activity: Not on file  Stress: Not on file  Social Connections: Not on file  Intimate Partner Violence: Not on file      Review of Systems  Constitutional:  Positive for fatigue.  HENT:  Positive for congestion, postnasal drip, rhinorrhea, sinus pressure and sinus pain.   Respiratory:  Positive for  cough (green drainage), chest tightness, shortness of breath and wheezing.   Cardiovascular: Negative.  Negative for chest pain and palpitations.  Gastrointestinal: Negative.   Musculoskeletal: Negative.   Neurological:  Positive for headaches.  Hematological: Negative.   Psychiatric/Behavioral: Negative.      Vital Signs: BP 135/75 Comment: 156/81  Pulse 78   Temp 98.2 F (36.8 C)   Resp 16   Ht '5\' 9"'$  (1.753 m)   Wt 135 lb (61.2 kg)   SpO2 94%   BMI 19.94 kg/m    Physical Exam Vitals reviewed.  Constitutional:      General: He is not in acute distress.    Appearance: Normal appearance. He is normal weight. He is not ill-appearing.   HENT:     Head: Normocephalic and atraumatic.     Right Ear: Tympanic membrane, ear canal and external ear normal.     Left Ear: Tympanic membrane, ear canal and external ear normal.     Nose: Congestion present.     Mouth/Throat:     Mouth: Mucous membranes are moist.     Pharynx: Posterior oropharyngeal erythema present.  Eyes:     Pupils: Pupils are equal, round, and reactive to light.  Cardiovascular:     Rate and Rhythm: Normal rate and regular rhythm.     Heart sounds: Normal heart sounds. No murmur heard. Pulmonary:     Effort: Pulmonary effort is normal. No accessory muscle usage or respiratory distress.     Breath sounds: Examination of the right-middle field reveals wheezing. Examination of the left-middle field reveals wheezing. Examination of the right-lower field reveals wheezing. Examination of the left-lower field reveals wheezing. Wheezing present.  Neurological:     Mental Status: He is alert and oriented to person, place, and time.  Psychiatric:        Mood and Affect: Mood normal.        Behavior: Behavior normal.        Assessment/Plan: 1. Acute bronchitis with COPD ([place]) Treatment prescribed via zpak and prednisone taper.  - azithromycin (ZITHROMAX) 250 MG tablet; Take 2 tablets on day 1, then 1 tablet daily on days 2 through 5  Dispense: 6 tablet; Refill: 0 - predniSONE (STERAPRED UNI-PAK 21 TAB) 10 MG (21) TBPK tablet; Use as directed for 6 days  Dispense: 21 tablet; Refill: 0  2. Chronic obstructive pulmonary disease, unspecified COPD type ([place]) Continue trelegy as prescribed. Rescue inhaler refills ordered as well. CT chest low dose ordered - Fluticasone-Umeclidin-Vilant (TRELEGY ELLIPTA) 100-62.5-25 MCG/ACT AEPB; Inhale 1 puff into the lungs daily.  Dispense: 60 each; Refill: 3 - albuterol (VENTOLIN HFA) 108 (90 Base) MCG/ACT inhaler; Inhale 2 puffs into the lungs every 6 (six) hours as needed for wheezing or shortness of breath.  Dispense: 26.8 g;  Refill: 2 - CT CHEST LUNG CA SCREEN LOW DOSE W/O CM; Future  3. Smokes less than 1 pack a day with greater than 30 pack year history Lung cancer screening ordered via low dose CT chest.  - CT CHEST LUNG CA SCREEN LOW DOSE W/O CM; Future   General Counseling: Chase Burnett understanding of the findings of todays visit and agrees with plan of treatment. I have discussed any further diagnostic evaluation that may be needed or ordered today. We also reviewed his medications today. he has been encouraged to call the office with any questions or concerns that should arise related to todays visit.    No orders of the defined types were placed in  this encounter.   Meds ordered this encounter  Medications   azithromycin (ZITHROMAX) 250 MG tablet    Sig: Take 2 tablets on day 1, then 1 tablet daily on days 2 through 5    Dispense:  6 tablet    Refill:  0   predniSONE (STERAPRED UNI-PAK 21 TAB) 10 MG (21) TBPK tablet    Sig: Use as directed for 6 days    Dispense:  21 tablet    Refill:  0   Fluticasone-Umeclidin-Vilant (TRELEGY ELLIPTA) 100-62.5-25 MCG/ACT AEPB    Sig: Inhale 1 puff into the lungs daily.    Dispense:  60 each    Refill:  3   albuterol (VENTOLIN HFA) 108 (90 Base) MCG/ACT inhaler    Sig: Inhale 2 puffs into the lungs every 6 (six) hours as needed for wheezing or shortness of breath.    Dispense:  26.8 g    Refill:  2    Return in about 6 months (around 08/25/2022) for F/U, pulmonary only with Haifa Hatton or [facility]. .   Total time spent:30 Minutes Time spent includes review of chart, medications, test results, and follow up plan with the patient.   [place] Controlled Substance Database was reviewed by me.  This patient was seen by Jonetta Osgood, FNP-C in collaboration with Dr. Clayborn Bigness as a part of collaborative care agreement.   Belva Koziel R. Valetta Fuller, MSN, FNP-C Internal medicine

## 2022-03-03 ENCOUNTER — Encounter: Payer: Self-pay | Admitting: Nurse Practitioner

## 2022-03-04 ENCOUNTER — Telehealth: Payer: Self-pay | Admitting: Physician Assistant

## 2022-03-06 NOTE — Telephone Encounter (Signed)
Error

## 2022-03-11 LAB — PULMONARY FUNCTION TEST

## 2022-03-18 ENCOUNTER — Telehealth: Payer: Self-pay | Admitting: Physician Assistant

## 2022-03-18 NOTE — Telephone Encounter (Signed)
Patient has not returned call to [facility] to schedule CT. No answer when calling patient. MB is full. Lvm with daughter-Toni

## 2022-03-26 ENCOUNTER — Ambulatory Visit
Admission: RE | Admit: 2022-03-26 | Discharge: 2022-03-26 | Disposition: A | Discharge status: Home / Self Care | Payer: PPO | Source: Ambulatory Visit | Attending: Nurse Practitioner | Admitting: Nurse Practitioner

## 2022-03-26 DIAGNOSIS — F17210 Nicotine dependence, cigarettes, uncomplicated: Secondary | ICD-10-CM

## 2022-03-26 DIAGNOSIS — J449 Chronic obstructive pulmonary disease, unspecified: Secondary | ICD-10-CM

## 2022-03-27 ENCOUNTER — Telehealth: Payer: Self-pay

## 2022-03-27 NOTE — Telephone Encounter (Signed)
As per alyssa discuss test result at next visit

## 2022-03-27 NOTE — Progress Notes (Signed)
These results will be discussed with the patient at his upcoming visit on 4/1 with Lauren PA-C

## 2022-04-07 ENCOUNTER — Ambulatory Visit (INDEPENDENT_AMBULATORY_CARE_PROVIDER_SITE_OTHER): Payer: PPO | Admitting: Physician Assistant

## 2022-04-07 ENCOUNTER — Encounter: Payer: Self-pay | Admitting: Physician Assistant

## 2022-04-07 VITALS — BP 140/72 | HR 94 | Temp 97.8°F | Resp 16 | Ht 69.0 in | Wt 133.0 lb

## 2022-04-07 DIAGNOSIS — F411 Generalized anxiety disorder: Secondary | ICD-10-CM

## 2022-04-07 DIAGNOSIS — J449 Chronic obstructive pulmonary disease, unspecified: Secondary | ICD-10-CM

## 2022-04-07 DIAGNOSIS — I700 Atherosclerosis of aorta: Secondary | ICD-10-CM

## 2022-04-07 DIAGNOSIS — F17219 Nicotine dependence, cigarettes, with unspecified nicotine-induced disorders: Secondary | ICD-10-CM | POA: Diagnosis not present

## 2022-04-07 DIAGNOSIS — R918 Other nonspecific abnormal finding of lung field: Secondary | ICD-10-CM | POA: Diagnosis not present

## 2022-04-07 MED ORDER — CLONAZEPAM 0.5 MG PO TABS: ORAL_TABLET | ORAL | 1 refills | Status: DC

## 2022-04-07 MED ORDER — TRAZODONE HCL 50 MG PO TABS: ORAL_TABLET | ORAL | 1 refills | Status: DC

## 2022-04-07 NOTE — Progress Notes (Signed)
[facility] [address] [place], [place] [address]  Internal MEDICINE  Office Visit Note  Patient Name: Chase Burnett  [id number]  [id number]  Date of Service: 04/16/2022  Chief Complaint  Patient presents with   Follow-up    Review CT   Hyperlipidemia   Quality Metric Gaps    TDAP and Shingles vaccines    HPI Pt is here for routine follow up -using trelegy daily and using albuterol only as needed -Ct showed new nodules, probably benign. Recommend 69mo follow up and will go ahead and order and follow up with pulmonology. Atherosclerosis and CAD seen on previous imaging. Pt was taking lipitor previously but had stopped and was told to restart for CV protection--will need to confirm if restarted CT chest 03/26/22 IMPRESSION: 1. Multiple pulmonary nodules, many of which are new compared to the prior study, overall categorized as Lung-RADS 3, probably benign findings. Short-term follow-up in 6 months is recommended with repeat low-dose chest CT without contrast (please use the following order, "CT CHEST LCS NODULE FOLLOW-UP W/O CM"). 2. The "S" modifier above refers to potentially clinically significant non lung cancer related findings. Specifically, there is aortic atherosclerosis, in addition to left main and three-vessel coronary artery disease. Please note that although the presence of coronary artery calcium documents the presence of coronary artery disease, the severity of this disease and any potential stenosis cannot be assessed on this non-gated CT examination. Assessment for potential risk factor modification, dietary therapy or pharmacologic therapy may be warranted, if clinically indicated. 3. Mild diffuse bronchial wall thickening with moderate centrilobular and paraseptal emphysema and saber sheath trachea; imaging findings suggestive of underlying COPD.   Aortic Atherosclerosis (ICD10-I70.0) and Emphysema (ICD10-J43.9).  -Working 30 hours some  weeks now [employer] well with trazodone -Using klonopin only as needed -He was anxious to hear about test and may be driving up BP in office and will monitor, did improve some on recheck  Current Medication: Outpatient Encounter Medications as of 04/07/2022  Medication Sig   albuterol (VENTOLIN HFA) 108 (90 Base) MCG/ACT inhaler Inhale 2 puffs into the lungs every 6 (six) hours as needed for wheezing or shortness of breath.   Fluticasone-Umeclidin-Vilant (TRELEGY ELLIPTA) 100-62.5-25 MCG/ACT AEPB Inhale 1 puff into the lungs daily.   predniSONE (STERAPRED UNI-PAK 21 TAB) 10 MG (21) TBPK tablet Use as directed for 6 days   [DISCONTINUED] clonazePAM (KLONOPIN) 0.5 MG tablet Take 1/2-1 tablet by mouth daily as needed   [DISCONTINUED] traZODone (DESYREL) 50 MG tablet Take 1 tablet by mouth nightly before bed   clonazePAM (KLONOPIN) 0.5 MG tablet Take 1/2-1 tablet by mouth daily as needed   traZODone (DESYREL) 50 MG tablet Take 1 tablet by mouth nightly before bed   No facility-administered encounter medications on file as of 04/07/2022.    Surgical History: Past Surgical History:  Procedure Laterality Date   CHEST TUBE INSERTION      Medical History: Past Medical History:  Diagnosis Date   COPD (chronic obstructive pulmonary disease)    Hyperlipidemia    Pneumothorax     Family History: Family History  Problem Relation Age of Onset   Cancer Mother    Anxiety disorder Brother    COPD Brother     Social History   Socioeconomic History   Marital status: Married    Spouse name: Not on file   Number of children: Not on file   Years of education: Not on file   Highest education level: Not on  file  Occupational History   Not on file  Tobacco Use   Smoking status: Every Day    Packs/day: 0.50    Years: 40.00    Additional pack years: 0.00    Total pack years: 20.00    Types: Cigarettes   Smokeless tobacco: Never   Tobacco comments:    Pt down to 1/2 pk or less daily    Substance and Sexual Activity   Alcohol use: Not Currently   Drug use: Never   Sexual activity: Not on file  Other Topics Concern   Not on file  Social History Narrative   Not on file   Social Determinants of Health   Financial Resource Strain: Not on file  Food Insecurity: Not on file  Transportation Needs: Not on file  Physical Activity: Not on file  Stress: Not on file  Social Connections: Not on file  Intimate Partner Violence: Not on file      Review of Systems  Constitutional:  Negative for chills, fatigue and unexpected weight change.  HENT:  Negative for congestion, postnasal drip, rhinorrhea, sneezing and sore throat.   Eyes:  Negative for redness.  Respiratory:  Negative for cough, chest tightness, shortness of breath and wheezing.   Cardiovascular:  Negative for chest pain and palpitations.  Gastrointestinal:  Negative for abdominal pain, constipation, diarrhea, nausea and vomiting.  Genitourinary:  Negative for dysuria and frequency.  Musculoskeletal:  Negative for arthralgias, back pain, joint swelling and neck pain.  Skin:  Negative for rash.  Neurological:  Positive for numbness. Negative for tremors.  Hematological:  Negative for adenopathy. Does not bruise/bleed easily.  Psychiatric/Behavioral:  Negative for behavioral problems (Depression) and suicidal ideas. The patient is nervous/anxious.     Vital Signs: BP (!) 140/72 Comment: 145/67  Pulse 94   Temp 97.8 F (36.6 C)   Resp 16   Ht 5\' 9"  (1.753 m)   Wt 133 lb (60.3 kg)   SpO2 92%   BMI 19.64 kg/m    Physical Exam Vitals and nursing note reviewed.  Constitutional:      General: He is not in acute distress.    Appearance: Normal appearance. He is well-developed. He is not diaphoretic.  HENT:     Head: Normocephalic and atraumatic.     Mouth/Throat:     Pharynx: No oropharyngeal exudate.  Eyes:     Pupils: Pupils are equal, round, and reactive to light.  Neck:     Thyroid: No  thyromegaly.     Vascular: No JVD.     Trachea: No tracheal deviation.  Cardiovascular:     Rate and Rhythm: Normal rate and regular rhythm.     Heart sounds: Normal heart sounds. No murmur heard.    No friction rub. No gallop.  Pulmonary:     Effort: Pulmonary effort is normal. No respiratory distress.     Breath sounds: No wheezing or rales.  Chest:     Chest wall: No tenderness.  Abdominal:     General: Bowel sounds are normal.     Palpations: Abdomen is soft.     Tenderness: There is no abdominal tenderness.  Musculoskeletal:        General: Normal range of motion.     Cervical back: Normal range of motion and neck supple.  Lymphadenopathy:     Cervical: No cervical adenopathy.  Skin:    General: Skin is warm and dry.  Neurological:     Mental Status: He is alert and  oriented to person, place, and time.     Cranial Nerves: No cranial nerve deficit.  Psychiatric:        Behavior: Behavior normal.        Thought Content: Thought content normal.        Judgment: Judgment normal.        Assessment/Plan: 1. Generalized anxiety disorder May continue klonopin only prn and trazodone before bed - clonazePAM (KLONOPIN) 0.5 MG tablet; Take 1/2-1 tablet by mouth daily as needed  Dispense: 30 tablet; Refill: 1 - traZODone (DESYREL) 50 MG tablet; Take 1 tablet by mouth nightly before bed  Dispense: 90 tablet; Refill: 1  2. Chronic obstructive pulmonary disease, unspecified COPD type Will order follow up imaging in 6 months for stability--follow up with pulmonology - CT CHEST LCS NODULE F/U LOW DOSE WO CONTRAST; Future  3. Cigarette nicotine dependence with nicotine-induced disorder - CT CHEST LCS NODULE F/U LOW DOSE WO CONTRAST; Future  4. Multiple lung nodules on CT Will order follow up imaging in 6 months for stability--follow up with pulmonology - CT CHEST LCS NODULE F/U LOW DOSE WO CONTRAST; Future   General Counseling: edman lipsey understanding of the findings  of todays visit and agrees with plan of treatment. I have discussed any further diagnostic evaluation that may be needed or ordered today. We also reviewed his medications today. he has been encouraged to call the office with any questions or concerns that should arise related to todays visit.    Orders Placed This Encounter  Procedures   CT CHEST LCS NODULE F/U LOW DOSE WO CONTRAST    Meds ordered this encounter  Medications   clonazePAM (KLONOPIN) 0.5 MG tablet    Sig: Take 1/2-1 tablet by mouth daily as needed    Dispense:  30 tablet    Refill:  1   traZODone (DESYREL) 50 MG tablet    Sig: Take 1 tablet by mouth nightly before bed    Dispense:  90 tablet    Refill:  1    This patient was seen by Lynn Ito, PA-C in collaboration with Dr. Beverely Risen as a part of collaborative care agreement.   Total time spent:30 Minutes Time spent includes review of chart, medications, test results, and follow up plan with the patient.      Dr Chase Burnett Internal medicine

## 2022-05-28 ENCOUNTER — Ambulatory Visit: Payer: PPO | Admitting: Internal Medicine

## 2022-07-07 ENCOUNTER — Ambulatory Visit (INDEPENDENT_AMBULATORY_CARE_PROVIDER_SITE_OTHER): Payer: PPO | Admitting: Physician Assistant

## 2022-07-07 ENCOUNTER — Telehealth: Payer: Self-pay | Admitting: Physician Assistant

## 2022-07-07 ENCOUNTER — Encounter: Payer: Self-pay | Admitting: Physician Assistant

## 2022-07-07 VITALS — BP 124/84 | HR 92 | Temp 97.8°F | Resp 16 | Ht 69.0 in | Wt 129.0 lb

## 2022-07-07 DIAGNOSIS — R490 Dysphonia: Secondary | ICD-10-CM | POA: Diagnosis not present

## 2022-07-07 DIAGNOSIS — J449 Chronic obstructive pulmonary disease, unspecified: Secondary | ICD-10-CM | POA: Diagnosis not present

## 2022-07-07 DIAGNOSIS — F411 Generalized anxiety disorder: Secondary | ICD-10-CM

## 2022-07-07 DIAGNOSIS — F17219 Nicotine dependence, cigarettes, with unspecified nicotine-induced disorders: Secondary | ICD-10-CM

## 2022-07-07 MED ORDER — TRELEGY ELLIPTA 100-62.5-25 MCG/ACT IN AEPB: 1.0000 | INHALATION_SPRAY | Freq: Every day | RESPIRATORY_TRACT | 3 refills | Status: DC

## 2022-07-07 MED ORDER — TRAZODONE HCL 50 MG PO TABS: ORAL_TABLET | ORAL | 1 refills | Status: DC

## 2022-07-07 NOTE — Progress Notes (Signed)
[facility] [address] [place], [place] [address]  Internal MEDICINE  Office Visit Note  Patient Name: Chase Burnett  [id number]  [id number]  Date of Service: 07/07/2022  Chief Complaint  Patient presents with   Follow-up   COPD   Hyperlipidemia    HPI Pt is here for routine follow up -Taking trelegy in AM and using albuterol in evening when needed -Taking trazodone to help with sleep and this is working well -Taking 1/4 tab of klonopin as needed, last filled 4/1 and still has available refill when needed. -Protein shake daily now and this is helping as he was losing some weight and will need to monitor -voice hoarseness may be worsening some, will refer to ENT for further evaluation. He was worried about if thrush could be the cause since he uses trelegy, however denies any white patches/lesions and also has no mouth or throat soreness. -1/2 ppd and still working on quitting  Current Medication: Outpatient Encounter Medications as of 07/07/2022  Medication Sig   albuterol (VENTOLIN HFA) 108 (90 Base) MCG/ACT inhaler Inhale 2 puffs into the lungs every 6 (six) hours as needed for wheezing or shortness of breath.   clonazePAM (KLONOPIN) 0.5 MG tablet Take 1/2-1 tablet by mouth daily as needed   [DISCONTINUED] Fluticasone-Umeclidin-Vilant (TRELEGY ELLIPTA) 100-62.5-25 MCG/ACT AEPB Inhale 1 puff into the lungs daily.   [DISCONTINUED] predniSONE (STERAPRED UNI-PAK 21 TAB) 10 MG (21) TBPK tablet Use as directed for 6 days   [DISCONTINUED] traZODone (DESYREL) 50 MG tablet Take 1 tablet by mouth nightly before bed   Fluticasone-Umeclidin-Vilant (TRELEGY ELLIPTA) 100-62.5-25 MCG/ACT AEPB Inhale 1 puff into the lungs daily.   traZODone (DESYREL) 50 MG tablet Take 1 tablet by mouth nightly before bed   No facility-administered encounter medications on file as of 07/07/2022.    Surgical History: Past Surgical History:  Procedure Laterality Date   CHEST TUBE INSERTION       Medical History: Past Medical History:  Diagnosis Date   COPD (chronic obstructive pulmonary disease) (HCC)    Hyperlipidemia    Pneumothorax     Family History: Family History  Problem Relation Age of Onset   Cancer Mother    Anxiety disorder Brother    COPD Brother     Social History   Socioeconomic History   Marital status: Married    Spouse name: Not on file   Number of children: Not on file   Years of education: Not on file   Highest education level: Not on file  Occupational History   Not on file  Tobacco Use   Smoking status: Every Day    Packs/day: 0.50    Years: 40.00    Additional pack years: 0.00    Total pack years: 20.00    Types: Cigarettes   Smokeless tobacco: Never   Tobacco comments:    Pt down to 1/2 pk or less daily   Substance and Sexual Activity   Alcohol use: Not Currently   Drug use: Never   Sexual activity: Not on file  Other Topics Concern   Not on file  Social History Narrative   Not on file   Social Determinants of Health   Financial Resource Strain: Not on file  Food Insecurity: Not on file  Transportation Needs: Not on file  Physical Activity: Not on file  Stress: Not on file  Social Connections: Not on file  Intimate Partner Violence: Not on file      Review of Systems  Constitutional:  Negative for chills, fatigue and unexpected weight change.  HENT:  Positive for voice change. Negative for congestion, postnasal drip, rhinorrhea, sneezing and sore throat.   Eyes:  Negative for redness.  Respiratory:  Negative for cough, chest tightness and shortness of breath.   Cardiovascular:  Negative for chest pain and palpitations.  Gastrointestinal:  Negative for abdominal pain, constipation, diarrhea, nausea and vomiting.  Genitourinary:  Negative for dysuria and frequency.  Musculoskeletal:  Negative for arthralgias, back pain, joint swelling and neck pain.  Skin:  Negative for rash.  Neurological: Negative.  Negative for  tremors and numbness.  Hematological:  Negative for adenopathy. Does not bruise/bleed easily.  Psychiatric/Behavioral:  Negative for behavioral problems (Depression), sleep disturbance and suicidal ideas. The patient is nervous/anxious.     Vital Signs: BP 124/84   Pulse 92   Temp 97.8 F (36.6 C)   Resp 16   Ht 5\' 9"  (1.753 m)   Wt 129 lb (58.5 kg)   SpO2 95%   BMI 19.05 kg/m    Physical Exam Vitals and nursing note reviewed.  Constitutional:      General: He is not in acute distress.    Appearance: Normal appearance. He is well-developed. He is not diaphoretic.  HENT:     Head: Normocephalic and atraumatic.     Mouth/Throat:     Pharynx: No oropharyngeal exudate.  Eyes:     Pupils: Pupils are equal, round, and reactive to light.  Neck:     Thyroid: No thyromegaly.     Vascular: No JVD.     Trachea: No tracheal deviation.  Cardiovascular:     Rate and Rhythm: Normal rate and regular rhythm.     Heart sounds: Normal heart sounds. No murmur heard.    No friction rub. No gallop.  Pulmonary:     Effort: Pulmonary effort is normal. No respiratory distress.     Breath sounds: No wheezing or rales.  Chest:     Chest wall: No tenderness.  Abdominal:     General: Bowel sounds are normal.     Palpations: Abdomen is soft.  Musculoskeletal:        General: Normal range of motion.     Cervical back: Normal range of motion and neck supple.  Lymphadenopathy:     Cervical: No cervical adenopathy.  Skin:    General: Skin is warm and dry.  Neurological:     Mental Status: He is alert and oriented to person, place, and time.     Cranial Nerves: No cranial nerve deficit.  Psychiatric:        Behavior: Behavior normal.        Thought Content: Thought content normal.        Judgment: Judgment normal.        Assessment/Plan: 1. Generalized anxiety disorder May continue trazodone at night and klonopin prn - traZODone (DESYREL) 50 MG tablet; Take 1 tablet by mouth nightly  before bed  Dispense: 90 tablet; Refill: 1  2. Persistent hoarseness Will refer to ENT for further evaluation - Ambulatory referral to ENT  3. Chronic obstructive pulmonary disease, unspecified COPD type (HCC) Will continue trelegy as before and will follow up with pulmonology - Fluticasone-Umeclidin-Vilant (TRELEGY ELLIPTA) 100-62.5-25 MCG/ACT AEPB; Inhale 1 puff into the lungs daily.  Dispense: 60 each; Refill: 3  4. Cigarette nicotine dependence with nicotine-induced disorder Continue to work on smoking cessation and will continue with CT Chest screenings--a 6 month repeat was recommended after last  screening and will be due around  mid-Sept, order already placed and will follow up with pulmonology after   General Counseling: Jola Baptist understanding of the findings of todays visit and agrees with plan of treatment. I have discussed any further diagnostic evaluation that may be needed or ordered today. We also reviewed his medications today. he has been encouraged to call the office with any questions or concerns that should arise related to todays visit.    Orders Placed This Encounter  Procedures   Ambulatory referral to ENT    Meds ordered this encounter  Medications   Fluticasone-Umeclidin-Vilant (TRELEGY ELLIPTA) 100-62.5-25 MCG/ACT AEPB    Sig: Inhale 1 puff into the lungs daily.    Dispense:  60 each    Refill:  3   traZODone (DESYREL) 50 MG tablet    Sig: Take 1 tablet by mouth nightly before bed    Dispense:  90 tablet    Refill:  1    This patient was seen by Lynn Ito, PA-C in collaboration with Dr. Beverely Risen as a part of collaborative care agreement.   Total time spent:30 Minutes Time spent includes review of chart, medications, test results, and follow up plan with the patient.      Dr Lyndon Code Internal medicine

## 2022-07-07 NOTE — Telephone Encounter (Signed)
Otolaryngology referral sent via [facility] to [facility] ?

## 2022-07-08 ENCOUNTER — Telehealth: Payer: Self-pay | Admitting: Physician Assistant

## 2022-07-08 NOTE — Telephone Encounter (Signed)
Otolaryngology appointment 07/17/2022 @ [facility] 

## 2022-07-17 DIAGNOSIS — K219 Gastro-esophageal reflux disease without esophagitis: Secondary | ICD-10-CM | POA: Diagnosis not present

## 2022-07-17 DIAGNOSIS — J382 Nodules of vocal cords: Secondary | ICD-10-CM | POA: Diagnosis not present

## 2022-08-06 ENCOUNTER — Other Ambulatory Visit: Payer: Self-pay

## 2022-08-06 DIAGNOSIS — R0602 Shortness of breath: Secondary | ICD-10-CM

## 2022-08-13 ENCOUNTER — Ambulatory Visit: Payer: PPO | Admitting: Internal Medicine

## 2022-08-25 ENCOUNTER — Ambulatory Visit: Payer: PPO | Admitting: Nurse Practitioner

## 2022-09-18 ENCOUNTER — Other Ambulatory Visit: Payer: Self-pay

## 2022-09-18 MED ORDER — AZELASTINE HCL 0.1 % NA SOLN: 1.0000 | Freq: Every day | NASAL | 1 refills | Status: DC

## 2022-09-19 ENCOUNTER — Telehealth (INDEPENDENT_AMBULATORY_CARE_PROVIDER_SITE_OTHER): Payer: PPO | Admitting: Physician Assistant

## 2022-09-19 ENCOUNTER — Encounter: Payer: Self-pay | Admitting: Physician Assistant

## 2022-09-19 VITALS — Ht 69.0 in | Wt 129.0 lb

## 2022-09-19 DIAGNOSIS — J441 Chronic obstructive pulmonary disease with (acute) exacerbation: Secondary | ICD-10-CM

## 2022-09-19 DIAGNOSIS — R490 Dysphonia: Secondary | ICD-10-CM | POA: Diagnosis not present

## 2022-09-19 MED ORDER — AZITHROMYCIN 250 MG PO TABS: ORAL_TABLET | ORAL | 0 refills | Status: DC

## 2022-09-19 MED ORDER — PREDNISONE 10 MG PO TABS: ORAL_TABLET | ORAL | 0 refills | Status: DC

## 2022-09-19 NOTE — Progress Notes (Signed)
[facility] [address] [place], [place] [address]  Internal MEDICINE  Telephone Visit  Patient Name: Chase Burnett  [id number]  [id number]  Date of Service: 09/19/2022  I connected with the patient at 8:57 by telephone and verified the patients identity using two identifiers.   I discussed the limitations, risks, security and privacy concerns of performing an evaluation and management service by telephone and the availability of in person appointments. I also discussed with the patient that there may be a patient responsible charge related to the service.  The patient expressed understanding and agrees to proceed.    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 Given omeprazole and will follow up with them -All week has been feeling more SOB, had to leave work yesterday. Some wheezing, coughing up mucus. A little achey and runny nose. The azelastine nasal spray is helping -Worried about bronchitis -Covid negative -using trelegy, using albuterol this week when he normally doesn't need to  Current Medication: Outpatient Encounter Medications as of 09/19/2022  Medication Sig   albuterol (VENTOLIN HFA) 108 (90 Base) MCG/ACT inhaler Inhale 2 puffs into the lungs every 6 (six) hours as needed for wheezing or shortness of breath.   azelastine (ASTELIN) 0.1 % nasal spray Place 1 spray into both nostrils daily. Use in each nostril as directed   azithromycin (ZITHROMAX) 250 MG tablet Take one tab a day for 10 days for uri   clonazePAM (KLONOPIN) 0.5 MG tablet Take 1/2-1 tablet by mouth daily as needed   Fluticasone-Umeclidin-Vilant (TRELEGY ELLIPTA) 100-62.5-25 MCG/ACT AEPB Inhale 1 puff into the lungs daily.   predniSONE (DELTASONE) 10 MG tablet Take one tab 3 x day for 3 days, then  take one tab 2 x a day for 3 days and then take one tab a day for 3 days for copd   traZODone (DESYREL) 50 MG tablet Take 1 tablet by mouth nightly before bed   No facility-administered encounter medications on file as of 09/19/2022.    Surgical History: Past Surgical History:  Procedure Laterality Date   CHEST TUBE INSERTION      Medical History: Past Medical History:  Diagnosis Date   COPD (chronic obstructive pulmonary disease) (HCC)    Hyperlipidemia    Pneumothorax     Family History: Family History  Problem Relation Age of Onset   Cancer Mother    Anxiety disorder Brother    COPD Brother     Social History   Socioeconomic History   Marital status: Married    Spouse name: Not on file   Number of children: Not on file   Years of education: Not on file   Highest education level: Not on file  Occupational History   Not on file  Tobacco Use   Smoking status: Every Day    Current packs/day: 0.50    Average packs/day: 0.5 packs/day for 40.0 years (20.0 ttl pk-yrs)    Types: Cigarettes   Smokeless tobacco: Never   Tobacco comments:    Pt down to 1/2 pk or less daily   Substance and Sexual Activity   Alcohol use: Not Currently   Drug use: Never   Sexual activity: Not on file  Other Topics Concern  Not on file  Social History Narrative   Not on file   Social Determinants of Health   Financial Resource Strain: Not on file  Food Insecurity: Not on file  Transportation Needs: Not on file  Physical Activity: Not on file  Stress: Not on file  Social Connections: Not on file  Intimate Partner Violence: Not on file      Review of Systems  Constitutional:  Negative for fever.  HENT:  Positive for congestion, postnasal drip and voice change. Negative for mouth sores.   Respiratory:  Positive for cough, shortness of breath and wheezing.   Cardiovascular:  Negative for chest pain.  Genitourinary:  Negative for flank pain.  Musculoskeletal:  Positive for  myalgias.  Psychiatric/Behavioral: Negative.      Vital Signs: Ht 5\' 9"  (1.753 m)   Wt 129 lb (58.5 kg)   BMI 19.05 kg/m    Observation/Objective:  Pt is hoarse but able to carry out conversation   Assessment/Plan: 1. COPD with acute exacerbation (HCC) Will start zpak and prednisone and advised to use albuterol as needed. If not improving or worsening SOB then advised to go to ED. - azithromycin (ZITHROMAX) 250 MG tablet; Take one tab a day for 10 days for uri  Dispense: 10 tablet; Refill: 0 - predniSONE (DELTASONE) 10 MG tablet; Take one tab 3 x day for 3 days, then take one tab 2 x a day for 3 days and then take one tab a day for 3 days for copd  Dispense: 18 tablet; Refill: 0  2. 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 I have discussed any further diagnostic evaluation that may be needed or ordered today. We also reviewed his medications today. he has been encouraged to call the office with any questions or concerns that should arise related to todays visit.    No orders of the defined types were placed in this encounter.   Meds ordered this encounter  Medications   azithromycin (ZITHROMAX) 250 MG tablet    Sig: Take one tab a day for 10 days for uri    Dispense:  10 tablet    Refill:  0   predniSONE (DELTASONE) 10 MG tablet    Sig: Take one tab 3 x day for 3 days, then take one tab 2 x a day for 3 days and then take one tab a day for 3 days for copd    Dispense:  18 tablet    Refill:  0    Time spent:25 Minutes    Dr Lyndon Code Internal medicine

## 2022-09-23 DIAGNOSIS — J441 Chronic obstructive pulmonary disease with (acute) exacerbation: Secondary | ICD-10-CM | POA: Diagnosis not present

## 2022-09-23 DIAGNOSIS — R490 Dysphonia: Secondary | ICD-10-CM | POA: Diagnosis not present

## 2022-09-23 DIAGNOSIS — J383 Other diseases of vocal cords: Secondary | ICD-10-CM | POA: Diagnosis not present

## 2022-09-25 ENCOUNTER — Ambulatory Visit: Payer: PPO | Admitting: Nurse Practitioner

## 2022-09-30 ENCOUNTER — Telehealth: Payer: Self-pay | Admitting: Physician Assistant

## 2022-09-30 NOTE — Telephone Encounter (Signed)
Received [facility] medical clearance. Gave to Lauren for signature-nm

## 2022-10-09 ENCOUNTER — Ambulatory Visit: Payer: PPO | Admitting: Physician Assistant

## 2022-10-17 ENCOUNTER — Encounter: Payer: Self-pay | Admitting: Physician Assistant

## 2022-10-17 ENCOUNTER — Ambulatory Visit: Payer: PPO | Admitting: Physician Assistant

## 2022-10-17 ENCOUNTER — Telehealth: Payer: Self-pay

## 2022-10-17 ENCOUNTER — Telehealth: Payer: Self-pay | Admitting: Physician Assistant

## 2022-10-17 VITALS — BP 110/60 | HR 64 | Temp 98.1°F | Resp 16 | Ht 69.0 in | Wt 127.0 lb

## 2022-10-17 DIAGNOSIS — R7989 Other specified abnormal findings of blood chemistry: Secondary | ICD-10-CM

## 2022-10-17 DIAGNOSIS — Z125 Encounter for screening for malignant neoplasm of prostate: Secondary | ICD-10-CM | POA: Diagnosis not present

## 2022-10-17 DIAGNOSIS — J449 Chronic obstructive pulmonary disease, unspecified: Secondary | ICD-10-CM

## 2022-10-17 DIAGNOSIS — E782 Mixed hyperlipidemia: Secondary | ICD-10-CM

## 2022-10-17 DIAGNOSIS — R490 Dysphonia: Secondary | ICD-10-CM | POA: Diagnosis not present

## 2022-10-17 DIAGNOSIS — R0602 Shortness of breath: Secondary | ICD-10-CM

## 2022-10-17 DIAGNOSIS — R5383 Other fatigue: Secondary | ICD-10-CM | POA: Diagnosis not present

## 2022-10-17 DIAGNOSIS — F411 Generalized anxiety disorder: Secondary | ICD-10-CM | POA: Diagnosis not present

## 2022-10-17 MED ORDER — CLONAZEPAM 0.5 MG PO TABS: ORAL_TABLET | ORAL | 1 refills | Status: DC

## 2022-10-17 MED ORDER — PNEUMOCOCCAL 20-VAL CONJ VACC 0.5 ML IM SUSY: 0.5000 mL | PREFILLED_SYRINGE | INTRAMUSCULAR | 0 refills | Status: AC

## 2022-10-17 NOTE — Progress Notes (Signed)
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 Beverely Risen and will provide pt with nebulizer to use treatment a few days prior to surgery to optimize him for surgery. He is considered high risk per Dr. Freda Munro. -working to quit smoking and discussed this would be best for his health -due for ct chest follow up -will go ahead and order fasting labs prior to CPE  Current Medication: Outpatient Encounter Medications as of 10/17/2022  Medication Sig   albuterol (VENTOLIN HFA) 108 (90 Base) MCG/ACT inhaler Inhale 2 puffs into the lungs every 6 (six) hours as needed for wheezing or shortness of breath.   azelastine (ASTELIN) 0.1 % nasal spray Place 1 spray into both nostrils daily. Use in each nostril as directed   Fluticasone-Umeclidin-Vilant (TRELEGY ELLIPTA) 100-62.5-25 MCG/ACT AEPB Inhale 1 puff into the lungs daily.   ipratropium-albuterol (DUONEB) 0.5-2.5 (3) MG/3ML SOLN Take 3 mLs by nebulization every 6 (six) hours as needed.   traZODone (DESYREL) 50 MG tablet Take 1 tablet by mouth nightly before bed   [DISCONTINUED] azithromycin (ZITHROMAX) 250 MG tablet Take one tab a day for 10 days for uri   [DISCONTINUED] clonazePAM (KLONOPIN) 0.5 MG tablet Take 1/2-1 tablet by mouth daily as needed   [DISCONTINUED] pneumococcal 20-valent conjugate vaccine (PREVNAR 20) 0.5 ML injection Inject 0.5 mLs into the muscle tomorrow at 10 am.   [DISCONTINUED]  predniSONE (DELTASONE) 10 MG tablet Take one tab 3 x day for 3 days, then take one tab 2 x a day for 3 days and then take one tab a day for 3 days for copd   clonazePAM (KLONOPIN) 0.5 MG tablet Take 1/2-1 tablet by mouth daily as needed   pneumococcal 20-valent conjugate vaccine (PREVNAR 20) 0.5 ML injection Inject 0.5 mLs into the muscle tomorrow at 10 am for 1 dose.   No facility-administered encounter medications on file as of 10/17/2022.    Surgical History: Past Surgical History:  Procedure Laterality Date   CHEST TUBE INSERTION      Medical History: Past Medical History:  Diagnosis Date   COPD (chronic obstructive pulmonary disease) (HCC)    Hyperlipidemia    Pneumothorax     Family History: Family History  Problem Relation Age of Onset   Cancer Mother    Anxiety disorder Brother    COPD Brother     Social History   Socioeconomic History   Marital status: Married    Spouse name: Not on file   Number of children: Not on file   Years of education: Not on file   Highest education level: Not on file  Occupational History   Not on file  Tobacco Use   Smoking status: Every Day    Current packs/day: 0.50    Average packs/day: 0.5 packs/day for 40.0 years (20.0 ttl pk-yrs)    Types: Cigarettes   Smokeless tobacco: Never   Tobacco comments:    Pt down to  1/2 pk or less daily   Substance and Sexual Activity   Alcohol use: Not Currently   Drug use: Never   Sexual activity: Not on file  Other Topics Concern   Not on file  Social History Narrative   Not on file   Social Determinants of Health   Financial Resource Strain: Not on file  Food Insecurity: Not on file  Transportation Needs: Not on file  Physical Activity: Not on file  Stress: Not on file  Social Connections: Not on file  Intimate Partner Violence: Not on file      Review of Systems  Constitutional:  Negative for chills, fatigue and unexpected weight change.  HENT:  Positive for voice change.  Negative for congestion, postnasal drip, rhinorrhea, sneezing and sore throat.   Eyes:  Negative for redness.  Respiratory:  Positive for wheezing. Negative for cough, chest tightness and shortness of breath.   Cardiovascular:  Negative for chest pain and palpitations.  Gastrointestinal:  Negative for abdominal pain, constipation, diarrhea, nausea and vomiting.  Genitourinary:  Negative for dysuria and frequency.  Musculoskeletal:  Negative for arthralgias, back pain, joint swelling and neck pain.  Skin:  Negative for rash.  Neurological: Negative.  Negative for tremors and numbness.  Hematological:  Negative for adenopathy. Does not bruise/bleed easily.  Psychiatric/Behavioral:  Negative for behavioral problems (Depression), sleep disturbance and suicidal ideas. The patient is nervous/anxious.     Vital Signs: BP 110/60   Pulse 64   Temp 98.1 F (36.7 C)   Resp 16   Ht 5\' 9"  (1.753 m)   Wt 127 lb (57.6 kg)   SpO2 97%   PF (!) 2 L/min   BMI 18.75 kg/m    Physical Exam Vitals and nursing note reviewed.  Constitutional:      General: He is not in acute distress.    Appearance: Normal appearance. He is well-developed. He is not diaphoretic.  HENT:     Head: Normocephalic and atraumatic.     Mouth/Throat:     Pharynx: No oropharyngeal exudate.  Eyes:     Pupils: Pupils are equal, round, and reactive to light.  Neck:     Thyroid: No thyromegaly.     Vascular: No JVD.     Trachea: No tracheal deviation.  Cardiovascular:     Rate and Rhythm: Normal rate and regular rhythm.     Heart sounds: Normal heart sounds. No murmur heard.    No friction rub. No gallop.  Pulmonary:     Effort: Pulmonary effort is normal. No respiratory distress.     Breath sounds: Wheezing present. No rales.  Chest:     Chest wall: No tenderness.  Abdominal:     General: Bowel sounds are normal.     Palpations: Abdomen is soft.  Musculoskeletal:        General: Normal range of motion.      Cervical back: Normal range of motion and neck supple.  Lymphadenopathy:     Cervical: No cervical adenopathy.  Skin:    General: Skin is warm and dry.  Neurological:     Mental Status: He is alert and oriented to person, place, and time.     Cranial Nerves: No cranial nerve deficit.  Psychiatric:        Behavior: Behavior normal.        Thought Content: Thought content normal.        Judgment: Judgment normal.        Assessment/Plan: 1. Generalized  anxiety disorder May continue klonopin only prn and will avoid prior to surgery - clonazePAM (KLONOPIN) 0.5 MG tablet; Take 1/2-1 tablet by mouth daily as needed  Dispense: 30 tablet; Refill: 1  2. 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 Freda Munro pt is considered high risk for surgery  3. Chronic obstructive pulmonary disease, unspecified COPD type (HCC) Continue trelegy as before, given neb to optimize breathing and may use for a few days before surgery - For home use only DME Nebulizer machine  4. Screening PSA (prostate specific antigen) - PSA Total (Reflex To Free)  5. Abnormal thyroid blood test - TSH + free T4  6. Mixed hyperlipidemia - Lipid Panel With LDL/HDL Ratio  7. Other fatigue - CBC w/Diff/Platelet - Comprehensive metabolic panel - TSH + free T4 - Lipid Panel With LDL/HDL Ratio - PSA Total (Reflex To Free)  8. SOB (shortness of breath) Cleda Daub ordered  General Counseling: Jola Baptist understanding of the findings of todays visit and agrees with plan of treatment. I have discussed any further diagnostic evaluation that may be needed or ordered today. We also reviewed his medications today. he has been encouraged to call the office with any questions or concerns that should arise related to todays visit.    Orders Placed This Encounter  Procedures   For home use only DME Nebulizer machine   CBC w/Diff/Platelet   Comprehensive metabolic panel    TSH + free T4   Lipid Panel With LDL/HDL Ratio   PSA Total (Reflex To Free)    Meds ordered this encounter  Medications   pneumococcal 20-valent conjugate vaccine (PREVNAR 20) 0.5 ML injection    Sig: Inject 0.5 mLs into the muscle tomorrow at 10 am for 1 dose.    Dispense:  0.5 mL    Refill:  0   clonazePAM (KLONOPIN) 0.5 MG tablet    Sig: Take 1/2-1 tablet by mouth daily as needed    Dispense:  30 tablet    Refill:  1    This patient was seen by Lynn Ito, PA-C in collaboration with Dr. Beverely Risen as a part of collaborative care agreement.   Total time spent:30 Minutes Time spent includes review of chart, medications, test results, and follow up plan with the patient.      Dr Lyndon Code Internal medicine

## 2022-10-17 NOTE — Telephone Encounter (Signed)
Send [facility] message that we gave pt nebulizer at visit and order in Epic

## 2022-10-17 NOTE — Telephone Encounter (Signed)
Surgical clearance and spiro faxed to [facility]; [phone]. Scanned-Toni

## 2022-10-17 NOTE — Telephone Encounter (Signed)
Faxed [facility] pt for duoneb  pres

## 2022-10-21 ENCOUNTER — Telehealth: Payer: Self-pay | Admitting: Physician Assistant

## 2022-10-21 NOTE — Telephone Encounter (Signed)
Received albuterol order. Gave to Lauren for [employer]

## 2022-10-22 DIAGNOSIS — R490 Dysphonia: Secondary | ICD-10-CM | POA: Diagnosis not present

## 2022-10-22 DIAGNOSIS — J449 Chronic obstructive pulmonary disease, unspecified: Secondary | ICD-10-CM | POA: Diagnosis not present

## 2022-10-22 DIAGNOSIS — J383 Other diseases of vocal cords: Secondary | ICD-10-CM | POA: Diagnosis not present

## 2022-10-23 ENCOUNTER — Telehealth: Payer: Self-pay | Admitting: Physician Assistant

## 2022-10-23 NOTE — Telephone Encounter (Signed)
Albuterol order signed. Faxed back to [facility]; [phone]. Scanned-Toni

## 2022-10-30 ENCOUNTER — Other Ambulatory Visit: Payer: Self-pay | Admitting: Physician Assistant

## 2022-10-30 DIAGNOSIS — J449 Chronic obstructive pulmonary disease, unspecified: Secondary | ICD-10-CM

## 2022-11-03 ENCOUNTER — Ambulatory Visit: Payer: PPO | Admitting: Physician Assistant

## 2022-11-19 ENCOUNTER — Telehealth: Payer: Self-pay | Admitting: Physician Assistant

## 2022-11-19 NOTE — Telephone Encounter (Signed)
Received Albuterol order from [facility]. Gave to Lauren for [employer]

## 2022-11-20 ENCOUNTER — Other Ambulatory Visit: Payer: Self-pay

## 2022-11-20 ENCOUNTER — Telehealth: Payer: Self-pay | Admitting: Physician Assistant

## 2022-11-20 NOTE — Telephone Encounter (Signed)
Albuterol order faxed back to [facility]; [phone]. Scanned-Toni

## 2022-11-22 DIAGNOSIS — J449 Chronic obstructive pulmonary disease, unspecified: Secondary | ICD-10-CM | POA: Diagnosis not present

## 2022-12-22 DIAGNOSIS — J449 Chronic obstructive pulmonary disease, unspecified: Secondary | ICD-10-CM | POA: Diagnosis not present

## 2023-01-10 ENCOUNTER — Other Ambulatory Visit: Payer: Self-pay | Admitting: Physician Assistant

## 2023-01-10 DIAGNOSIS — F411 Generalized anxiety disorder: Secondary | ICD-10-CM

## 2023-01-16 ENCOUNTER — Ambulatory Visit: Payer: PPO | Admitting: Physician Assistant

## 2023-01-20 DIAGNOSIS — R490 Dysphonia: Secondary | ICD-10-CM | POA: Diagnosis not present

## 2023-01-20 DIAGNOSIS — J383 Other diseases of vocal cords: Secondary | ICD-10-CM | POA: Diagnosis not present

## 2023-01-22 DIAGNOSIS — J449 Chronic obstructive pulmonary disease, unspecified: Secondary | ICD-10-CM | POA: Diagnosis not present

## 2023-01-29 ENCOUNTER — Ambulatory Visit (INDEPENDENT_AMBULATORY_CARE_PROVIDER_SITE_OTHER): Payer: PPO | Admitting: Physician Assistant

## 2023-01-29 ENCOUNTER — Encounter: Payer: Self-pay | Admitting: Physician Assistant

## 2023-01-29 VITALS — BP 125/70 | HR 89 | Temp 98.4°F | Resp 16 | Ht 69.0 in | Wt 134.2 lb

## 2023-01-29 DIAGNOSIS — F411 Generalized anxiety disorder: Secondary | ICD-10-CM | POA: Diagnosis not present

## 2023-01-29 DIAGNOSIS — Z0000 Encounter for general adult medical examination without abnormal findings: Secondary | ICD-10-CM

## 2023-01-29 DIAGNOSIS — J449 Chronic obstructive pulmonary disease, unspecified: Secondary | ICD-10-CM | POA: Diagnosis not present

## 2023-01-29 DIAGNOSIS — R490 Dysphonia: Secondary | ICD-10-CM

## 2023-01-29 MED ORDER — CLONAZEPAM 0.5 MG PO TABS: ORAL_TABLET | ORAL | 1 refills | Status: DC

## 2023-01-29 MED ORDER — ALBUTEROL SULFATE HFA 108 (90 BASE) MCG/ACT IN AERS: 2.0000 | INHALATION_SPRAY | Freq: Four times a day (QID) | RESPIRATORY_TRACT | 2 refills | Status: DC | PRN

## 2023-01-29 NOTE — Progress Notes (Signed)
[facility] [address] [place], [place] [address]  Internal MEDICINE  Office Visit Note  Patient Name: Chase Burnett  [id number]  [id number]  Date of Service: 02/04/2023  Chief Complaint  Patient presents with   Medicare Wellness   Hyperlipidemia   Quality Metric Gaps    Shingles Vaccine    HPI Malichi presents for an annual well visit Well-appearing 68 y.o. male Routine CRC screening: Colonoscopy done in 2022, due in 2032 Labs: previously ordered, will have this done now Other concerns: States local ENT did not do surgery due to pt being high risk, and was referred to [facility] and will be having procedure for vocal cyst in march. States they will be using a laser.  -Some right knee pain and stiffness in Am--wants to address in future. Doesn't bother him when walking too much -Will consider RSV and PNA vaccines, may need tdap. May consider shingles vaccines -taking klonopin daily now, takes 1/2 tab at a time, did discuss if increasing need then will really need to retry a daily medication such as SSRI -states never used neb and may turn in -will take some naps now that he is working long hours      01/29/2023    1:37 PM 01/10/2022   10:09 AM  MMSE - Mini Mental State Exam  Orientation to time 5 5  Orientation to Place 5 5  Registration 3 3  Attention/ Calculation 5 5  Recall 3 3  Language- name 2 objects 2 2  Language- repeat 1 1  Language- follow 3 step command 3 3  Language- read & follow direction 1 1  Write a sentence 1 1  Copy design 1 1  Total score 30 30    Functional Status Survey: Is the patient deaf or have difficulty hearing?: No Does the patient have difficulty seeing, even when wearing glasses/contacts?: No Does the patient have difficulty concentrating, remembering, or making decisions?: No Does the patient have difficulty walking or climbing stairs?: No Does the patient have difficulty dressing or bathing?: No Does the patient have  difficulty doing errands alone such as visiting a doctor's office or shopping?: No     02/24/2022    3:47 PM 04/07/2022    3:16 PM 07/07/2022    2:50 PM 10/17/2022    8:48 AM 01/29/2023    1:37 PM  Fall Risk  Falls in the past year? 0 0 0 0 0  Was there an injury with Fall? 0      Fall Risk Category Calculator 0      Patient at Risk for Falls Due to No Fall Risks  No Fall Risks    Fall risk Follow up Falls evaluation completed  Falls evaluation completed         01/29/2023    1:37 PM  Depression screen PHQ 2/9  Decreased Interest 0  Down, Depressed, Hopeless 0  PHQ - 2 Score 0        No data to display            Current Medication: Outpatient Encounter Medications as of 01/29/2023  Medication Sig   azelastine (ASTELIN) 0.1 % nasal spray Place 1 spray into both nostrils daily. Use in each nostril as directed   ipratropium-albuterol (DUONEB) 0.5-2.5 (3) MG/3ML SOLN Take 3 mLs by nebulization every 6 (six) hours as needed.   traZODone (DESYREL) 50 MG tablet TAKE 1 TABLET BY MOUTH EVERY NIGHT AT BEDTIME   TRELEGY ELLIPTA 100-62.5-25 MCG/ACT  AEPB INHALE 1 PUFF INTO THE LUNGS DAILY   [DISCONTINUED] albuterol (VENTOLIN HFA) 108 (90 Base) MCG/ACT inhaler Inhale 2 puffs into the lungs every 6 (six) hours as needed for wheezing or shortness of breath.   [DISCONTINUED] clonazePAM (KLONOPIN) 0.5 MG tablet Take 1/2-1 tablet by mouth daily as needed   albuterol (VENTOLIN HFA) 108 (90 Base) MCG/ACT inhaler Inhale 2 puffs into the lungs every 6 (six) hours as needed for wheezing or shortness of breath.   clonazePAM (KLONOPIN) 0.5 MG tablet Take 1/2-1 tablet by mouth daily as needed   No facility-administered encounter medications on file as of 01/29/2023.    Surgical History: Past Surgical History:  Procedure Laterality Date   CHEST TUBE INSERTION      Medical History: Past Medical History:  Diagnosis Date   COPD (chronic obstructive pulmonary disease) (HCC)    Hyperlipidemia     Pneumothorax     Family History: Family History  Problem Relation Age of Onset   Cancer Mother    Anxiety disorder Brother    COPD Brother     Social History   Socioeconomic History   Marital status: Married    Spouse name: Not on file   Number of children: Not on file   Years of education: Not on file   Highest education level: Not on file  Occupational History   Not on file  Tobacco Use   Smoking status: Every Day    Current packs/day: 0.50    Average packs/day: 0.5 packs/day for 40.0 years (20.0 ttl pk-yrs)    Types: Cigarettes   Smokeless tobacco: Never   Tobacco comments:    Pt down to 1/2 pk or less daily   Substance and Sexual Activity   Alcohol use: Not Currently   Drug use: Never   Sexual activity: Not on file  Other Topics Concern   Not on file  Social History Narrative   Not on file   Social Drivers of Health   Financial Resource Strain: Not on file  Food Insecurity: Not on file  Transportation Needs: Not on file  Physical Activity: Not on file  Stress: Not on file  Social Connections: Not on file  Intimate Partner Violence: Not on file      Review of Systems  Constitutional:  Negative for chills, fatigue and unexpected weight change.  HENT:  Positive for voice change. Negative for congestion, postnasal drip, rhinorrhea, sneezing and sore throat.   Eyes:  Negative for redness.  Respiratory:  Negative for cough, chest tightness, shortness of breath and wheezing.   Cardiovascular:  Negative for chest pain and palpitations.  Gastrointestinal:  Negative for abdominal pain, constipation, diarrhea, nausea and vomiting.  Genitourinary:  Negative for dysuria and frequency.  Musculoskeletal:  Positive for arthralgias. Negative for back pain, joint swelling and neck pain.  Skin:  Negative for rash.  Neurological: Negative.  Negative for tremors and numbness.  Hematological:  Negative for adenopathy. Does not bruise/bleed easily.  Psychiatric/Behavioral:   Negative for behavioral problems (Depression), sleep disturbance and suicidal ideas. The patient is nervous/anxious.     Vital Signs: BP 125/70   Pulse 89   Temp 98.4 F (36.9 C)   Resp 16   Ht 5\' 9"  (1.753 m)   Wt 134 lb 3.2 oz (60.9 kg)   SpO2 95%   BMI 19.82 kg/m    Physical Exam Vitals and nursing note reviewed.  Constitutional:      General: He is not in acute distress.  Appearance: Normal appearance. He is well-developed. He is not diaphoretic.  HENT:     Head: Normocephalic and atraumatic.  Neck:     Thyroid: No thyromegaly.     Vascular: No JVD.     Trachea: No tracheal deviation.  Cardiovascular:     Rate and Rhythm: Normal rate and regular rhythm.     Heart sounds: Normal heart sounds. No murmur heard.    No friction rub. No gallop.  Pulmonary:     Effort: Pulmonary effort is normal. No respiratory distress.     Breath sounds: No wheezing or rales.  Chest:     Chest wall: No tenderness.  Skin:    General: Skin is dry.  Neurological:     Mental Status: He is alert and oriented to person, place, and time.     Cranial Nerves: No cranial nerve deficit.  Psychiatric:        Behavior: Behavior normal.        Thought Content: Thought content normal.        Judgment: Judgment normal.        Assessment/Plan: 1. Encounter for Medicare annual wellness exam (Primary) AWV performed, labs previously ordered, will look into vaccines  2. Chronic obstructive pulmonary disease, unspecified COPD type (HCC) Continue trelegy daily and follow up with pulmonology - albuterol (VENTOLIN HFA) 108 (90 Base) MCG/ACT inhaler; Inhale 2 puffs into the lungs every 6 (six) hours as needed for wheezing or shortness of breath.  Dispense: 6.7 g; Refill: 2  3. Generalized anxiety disorder May continue klonopin as needed - clonazePAM (KLONOPIN) 0.5 MG tablet; Take 1/2-1 tablet by mouth daily as needed  Dispense: 30 tablet; Refill: 1  4. Persistent hoarseness Secondary to vocal  cyst and will be having surgery with [facility] soon    General Counseling: Jola Baptist understanding of the findings of todays visit and agrees with plan of treatment. I have discussed any further diagnostic evaluation that may be needed or ordered today. We also reviewed his medications today. he has been encouraged to call the office with any questions or concerns that should arise related to todays visit.    No orders of the defined types were placed in this encounter.   Meds ordered this encounter  Medications   albuterol (VENTOLIN HFA) 108 (90 Base) MCG/ACT inhaler    Sig: Inhale 2 puffs into the lungs every 6 (six) hours as needed for wheezing or shortness of breath.    Dispense:  6.7 g    Refill:  2   clonazePAM (KLONOPIN) 0.5 MG tablet    Sig: Take 1/2-1 tablet by mouth daily as needed    Dispense:  30 tablet    Refill:  1    Return in about 3 months (around 04/29/2023) for general follow up for PCP, needs CT scheduled with pulm visit after with DSK, needs lab slip printed.   Total time spent:35 Minutes Time spent includes review of chart, medications, test results, and follow up plan with the patient.   [place] Controlled Substance Database was reviewed by me.  This patient was seen by Lynn Ito, PA-C in collaboration with Dr. Beverely Risen as a part of collaborative care agreement.  Lynn Ito, PA-C Internal medicine

## 2023-01-30 ENCOUNTER — Telehealth: Payer: Self-pay | Admitting: Physician Assistant

## 2023-01-30 NOTE — Telephone Encounter (Signed)
Lvm notifying patient of CT appointment date, arrival time, location-Toni

## 2023-02-06 ENCOUNTER — Ambulatory Visit
Admission: RE | Admit: 2023-02-06 | Discharge: 2023-02-06 | Disposition: A | Discharge status: Home / Self Care | Payer: PPO | Source: Ambulatory Visit | Attending: Physician Assistant | Admitting: Physician Assistant

## 2023-02-06 DIAGNOSIS — J449 Chronic obstructive pulmonary disease, unspecified: Secondary | ICD-10-CM | POA: Insufficient documentation

## 2023-02-06 DIAGNOSIS — J432 Centrilobular emphysema: Secondary | ICD-10-CM | POA: Diagnosis not present

## 2023-02-06 DIAGNOSIS — F17219 Nicotine dependence, cigarettes, with unspecified nicotine-induced disorders: Secondary | ICD-10-CM | POA: Insufficient documentation

## 2023-02-06 DIAGNOSIS — R918 Other nonspecific abnormal finding of lung field: Secondary | ICD-10-CM | POA: Insufficient documentation

## 2023-02-17 ENCOUNTER — Telehealth: Payer: Self-pay

## 2023-02-17 NOTE — Telephone Encounter (Signed)
Pt advised  we received CT scan keep appt with lauren at 2/27 go over ct scan result

## 2023-02-22 DIAGNOSIS — J449 Chronic obstructive pulmonary disease, unspecified: Secondary | ICD-10-CM | POA: Diagnosis not present

## 2023-03-05 ENCOUNTER — Ambulatory Visit: Payer: PPO | Admitting: Physician Assistant

## 2023-03-05 ENCOUNTER — Other Ambulatory Visit: Payer: Self-pay | Admitting: Physician Assistant

## 2023-03-05 ENCOUNTER — Other Ambulatory Visit: Payer: Self-pay | Admitting: Nurse Practitioner

## 2023-03-05 ENCOUNTER — Encounter: Payer: Self-pay | Admitting: Physician Assistant

## 2023-03-05 VITALS — BP 112/70 | HR 82 | Temp 98.4°F | Resp 16 | Ht 69.0 in | Wt 130.4 lb

## 2023-03-05 DIAGNOSIS — E782 Mixed hyperlipidemia: Secondary | ICD-10-CM

## 2023-03-05 DIAGNOSIS — R7989 Other specified abnormal findings of blood chemistry: Secondary | ICD-10-CM | POA: Diagnosis not present

## 2023-03-05 DIAGNOSIS — J449 Chronic obstructive pulmonary disease, unspecified: Secondary | ICD-10-CM | POA: Diagnosis not present

## 2023-03-05 DIAGNOSIS — R918 Other nonspecific abnormal finding of lung field: Secondary | ICD-10-CM | POA: Diagnosis not present

## 2023-03-05 DIAGNOSIS — R5383 Other fatigue: Secondary | ICD-10-CM | POA: Diagnosis not present

## 2023-03-05 DIAGNOSIS — Z125 Encounter for screening for malignant neoplasm of prostate: Secondary | ICD-10-CM | POA: Diagnosis not present

## 2023-03-05 DIAGNOSIS — F411 Generalized anxiety disorder: Secondary | ICD-10-CM | POA: Diagnosis not present

## 2023-03-05 DIAGNOSIS — R490 Dysphonia: Secondary | ICD-10-CM

## 2023-03-05 MED ORDER — TRELEGY ELLIPTA 100-62.5-25 MCG/ACT IN AEPB: 1.0000 | INHALATION_SPRAY | Freq: Every day | RESPIRATORY_TRACT | 3 refills | Status: DC

## 2023-03-05 NOTE — Progress Notes (Signed)
 [facility] [address] [place], [place] [address]  Internal MEDICINE  Office Visit Note  Patient Name: Chase Burnett  [id number]  [id number]  Date of Service: 03/13/2023  Chief Complaint  Patient presents with   Follow-up    Review chest CT   Hyperlipidemia   Medication Refill    Trelegy     HPI Pt is here for routine follow up to review CT chest -Does need new lab order for blood work -pt reports being very anxious over CT results -reviewed CT as below and discussed options for monitoring. Will try for PET scan, but if not approved then will plan for repeat CT in 3 months. Non lung findings previously discussed  CT chest LCS nodule f/u 02/06/23 IMPRESSION: 1. Enlarging left upper lobe pulmonary nodule now categorized as Lung-RADS 4AS, suspicious. Follow up low-dose chest CT without contrast in 3 months (please use the following order, "CT CHEST LCS NODULE FOLLOW-UP W/O CM") is recommended. Alternatively, PET may be considered when there is a solid component 8mm or larger. 2. The "S" modifier above refers to potentially clinically significant non lung cancer related findings. Specifically, there is aortic atherosclerosis, in addition to left main and three-vessel coronary artery disease. Please note that although the presence of coronary artery calcium documents the presence of coronary artery disease, the severity of this disease and any potential stenosis cannot be assessed on this non-gated CT examination. Assessment for potential risk factor modification, dietary therapy or pharmacologic therapy may be warranted, if clinically indicated. 3. Mild diffuse bronchial wall thickening with mild centrilobular and paraseptal emphysema; imaging findings suggestive of underlying COPD.  Current Medication: Outpatient Encounter Medications as of 03/05/2023  Medication Sig   azelastine (ASTELIN) 0.1 % nasal spray Place 1 spray into both nostrils daily. Use in  each nostril as directed   clonazePAM (KLONOPIN) 0.5 MG tablet Take 1/2-1 tablet by mouth daily as needed   ipratropium-albuterol (DUONEB) 0.5-2.5 (3) MG/3ML SOLN Take 3 mLs by nebulization every 6 (six) hours as needed.   traZODone (DESYREL) 50 MG tablet TAKE 1 TABLET BY MOUTH EVERY NIGHT AT BEDTIME   [DISCONTINUED] albuterol (VENTOLIN HFA) 108 (90 Base) MCG/ACT inhaler Inhale 2 puffs into the lungs every 6 (six) hours as needed for wheezing or shortness of breath.   [DISCONTINUED] TRELEGY ELLIPTA 100-62.5-25 MCG/ACT AEPB INHALE 1 PUFF INTO THE LUNGS DAILY   No facility-administered encounter medications on file as of 03/05/2023.    Surgical History: Past Surgical History:  Procedure Laterality Date   CHEST TUBE INSERTION      Medical History: Past Medical History:  Diagnosis Date   COPD (chronic obstructive pulmonary disease) (HCC)    Hyperlipidemia    Pneumothorax     Family History: Family History  Problem Relation Age of Onset   Cancer Mother    Anxiety disorder Brother    COPD Brother     Social History   Socioeconomic History   Marital status: Married    Spouse name: Not on file   Number of children: Not on file   Years of education: Not on file   Highest education level: Not on file  Occupational History   Not on file  Tobacco Use   Smoking status: Every Day    Current packs/day: 0.50    Average packs/day: 0.5 packs/day for 40.0 years (20.0 ttl pk-yrs)    Types: Cigarettes   Smokeless tobacco: Never   Tobacco comments:    Pt down to 1/2 pk  or less daily   Substance and Sexual Activity   Alcohol use: Not Currently   Drug use: Never   Sexual activity: Not on file  Other Topics Concern   Not on file  Social History Narrative   Not on file   Social Drivers of Health   Financial Resource Strain: Not on file  Food Insecurity: Not on file  Transportation Needs: Not on file  Physical Activity: Not on file  Stress: Not on file  Social Connections: Not  on file  Intimate Partner Violence: Not on file      Review of Systems  Constitutional:  Negative for chills, fatigue and unexpected weight change.  HENT:  Positive for voice change. Negative for congestion, postnasal drip, rhinorrhea, sneezing and sore throat.   Eyes:  Negative for redness.  Respiratory:  Negative for cough, chest tightness, shortness of breath and wheezing.   Cardiovascular:  Negative for chest pain and palpitations.  Gastrointestinal:  Negative for abdominal pain, constipation, diarrhea, nausea and vomiting.  Genitourinary:  Negative for dysuria and frequency.  Musculoskeletal:  Negative for back pain, joint swelling and neck pain.  Skin:  Negative for rash.  Neurological: Negative.  Negative for tremors and numbness.  Hematological:  Negative for adenopathy. Does not bruise/bleed easily.  Psychiatric/Behavioral:  Negative for behavioral problems (Depression), sleep disturbance and suicidal ideas. The patient is nervous/anxious.     Vital Signs: BP 112/70   Pulse 82   Temp 98.4 F (36.9 C)   Resp 16   Ht 5\' 9"  (1.753 m)   Wt 130 lb 6.4 oz (59.1 kg)   SpO2 94%   BMI 19.26 kg/m    Physical Exam Vitals and nursing note reviewed.  Constitutional:      General: He is not in acute distress.    Appearance: Normal appearance. He is well-developed. He is not diaphoretic.  HENT:     Head: Normocephalic and atraumatic.  Neck:     Thyroid: No thyromegaly.     Vascular: No JVD.     Trachea: No tracheal deviation.  Cardiovascular:     Rate and Rhythm: Normal rate and regular rhythm.     Heart sounds: Normal heart sounds. No murmur heard.    No friction rub. No gallop.  Pulmonary:     Effort: Pulmonary effort is normal. No respiratory distress.     Breath sounds: No wheezing.  Chest:     Chest wall: No tenderness.  Skin:    General: Skin is dry.  Neurological:     Mental Status: He is alert and oriented to person, place, and time.     Cranial Nerves: No  cranial nerve deficit.  Psychiatric:        Behavior: Behavior normal.        Thought Content: Thought content normal.        Judgment: Judgment normal.        Assessment/Plan: 1. Multiple lung nodules on CT (Primary) Will go ahead and order PET scan due to suspicious nodule and borderline findings on CT nodule follow up scan. If PET cannot be done, then will move forward with repeat CT in 3 months for close monitoring. Will follow up with pulmonology - NM PET Image Restage (PS) Skull Base to Thigh (F-18 FDG); Future  2. Generalized anxiety disorder Has been anxious about results, may continue klonopin prn  3. Persistent hoarseness Followed by ENT and has upcoming procedure for vocal cord cyst  4. Chronic obstructive pulmonary disease, unspecified  COPD type (HCC) Continue inhalers as before, followed by pulmonology  5. Screening PSA (prostate specific antigen) - PSA Total (Reflex To Free)  6. Abnormal thyroid blood test - TSH + free T4  7. Mixed hyperlipidemia - Lipid Panel With LDL/HDL Ratio  8. Other fatigue - CBC w/Diff/Platelet - Comprehensive metabolic panel - TSH + free T4 - PSA Total (Reflex To Free) - Lipid Panel With LDL/HDL Ratio   General Counseling: Arlan verbalizes understanding of the findings of todays visit and agrees with plan of treatment. I have discussed any further diagnostic evaluation that may be needed or ordered today. We also reviewed his medications today. he has been encouraged to call the office with any questions or concerns that should arise related to todays visit.    Orders Placed This Encounter  Procedures   NM PET Image Restage (PS) Skull Base to Thigh (F-18 FDG)   CBC w/Diff/Platelet   Comprehensive metabolic panel   TSH + free T4   PSA Total (Reflex To Free)   Lipid Panel With LDL/HDL Ratio    No orders of the defined types were placed in this encounter.   This patient was seen by Lynn Ito, PA-C in collaboration  with Dr. Beverely Risen as a part of collaborative care agreement.   Total time spent:30 Minutes Time spent includes review of chart, medications, test results, and follow up plan with the patient.      Dr Lyndon Code Internal medicine

## 2023-03-06 ENCOUNTER — Telehealth: Payer: Self-pay | Admitting: Physician Assistant

## 2023-03-06 NOTE — Telephone Encounter (Signed)
 Lvm to discuss PET & follow up-Toni

## 2023-03-11 ENCOUNTER — Ambulatory Visit
Admission: RE | Admit: 2023-03-11 | Discharge: 2023-03-11 | Disposition: A | Discharge status: Home / Self Care | Payer: PPO | Source: Ambulatory Visit | Attending: Physician Assistant | Admitting: Physician Assistant

## 2023-03-11 DIAGNOSIS — M47812 Spondylosis without myelopathy or radiculopathy, cervical region: Secondary | ICD-10-CM | POA: Diagnosis not present

## 2023-03-11 DIAGNOSIS — R918 Other nonspecific abnormal finding of lung field: Secondary | ICD-10-CM | POA: Diagnosis not present

## 2023-03-11 DIAGNOSIS — J432 Centrilobular emphysema: Secondary | ICD-10-CM | POA: Diagnosis not present

## 2023-03-11 LAB — GLUCOSE, CAPILLARY: Glucose-Capillary: 84 mg/dL (ref 70–99)

## 2023-03-11 MED ORDER — FLUDEOXYGLUCOSE F - 18 (FDG) INJECTION
7.0900 | Freq: Once | INTRAVENOUS | Status: AC | PRN
  Administered 2023-03-11: 7.09 via INTRAVENOUS

## 2023-03-18 DIAGNOSIS — E785 Hyperlipidemia, unspecified: Secondary | ICD-10-CM | POA: Diagnosis not present

## 2023-03-18 DIAGNOSIS — J382 Nodules of vocal cords: Secondary | ICD-10-CM | POA: Diagnosis not present

## 2023-03-18 DIAGNOSIS — J383 Other diseases of vocal cords: Secondary | ICD-10-CM | POA: Diagnosis not present

## 2023-03-18 DIAGNOSIS — F17210 Nicotine dependence, cigarettes, uncomplicated: Secondary | ICD-10-CM | POA: Diagnosis not present

## 2023-03-18 DIAGNOSIS — J449 Chronic obstructive pulmonary disease, unspecified: Secondary | ICD-10-CM | POA: Diagnosis not present

## 2023-03-22 DIAGNOSIS — J449 Chronic obstructive pulmonary disease, unspecified: Secondary | ICD-10-CM | POA: Diagnosis not present

## 2023-04-02 ENCOUNTER — Encounter: Payer: Self-pay | Admitting: Physician Assistant

## 2023-04-02 ENCOUNTER — Ambulatory Visit (INDEPENDENT_AMBULATORY_CARE_PROVIDER_SITE_OTHER): Payer: PPO | Admitting: Physician Assistant

## 2023-04-02 VITALS — BP 115/70 | HR 88 | Temp 98.2°F | Resp 16 | Ht 69.0 in | Wt 129.4 lb

## 2023-04-02 DIAGNOSIS — F411 Generalized anxiety disorder: Secondary | ICD-10-CM

## 2023-04-02 DIAGNOSIS — R351 Nocturia: Secondary | ICD-10-CM

## 2023-04-02 DIAGNOSIS — J383 Other diseases of vocal cords: Secondary | ICD-10-CM | POA: Diagnosis not present

## 2023-04-02 NOTE — Progress Notes (Signed)
 [facility] [address] [place], [place] [address]  Internal MEDICINE  Office Visit Note  Patient Name: Chase Burnett  [id number]  [id number]  Date of Service: 04/02/2023  Chief Complaint  Patient presents with   Follow-up   Hyperlipidemia    HPI Pt is here for routine follow up -had his vocal cord cyst removed and reports this went well. Was supposed to have a virtual visit for follow up yesterday, but didn't have the app downloaded and will reschedule this. 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 Not aware of anything other symptoms. Will try limiting fluids at night and monitor. May need urology referral in future -Has pulmonary visit next week for PET scan results  Current Medication: Outpatient Encounter Medications as of 04/02/2023  Medication Sig   albuterol (VENTOLIN HFA) 108 (90 Base) MCG/ACT inhaler INHALE 2 PUFFS INTO THE LUNGS EVERY 6 HOURS AS NEEDED FOR WHEEZING OR SHORTNESS OF BREATH   azelastine (ASTELIN) 0.1 % nasal spray Place 1 spray into both nostrils daily. Use in each nostril as directed   clonazePAM (KLONOPIN) 0.5 MG tablet Take 1/2-1 tablet by mouth daily as needed   Fluticasone-Umeclidin-Vilant (TRELEGY ELLIPTA) 100-62.5-25 MCG/ACT AEPB Inhale 1 puff into the lungs daily.   ipratropium-albuterol (DUONEB) 0.5-2.5 (3) MG/3ML SOLN Take 3 mLs by nebulization every 6 (six) hours as needed.   traZODone (DESYREL) 50 MG tablet TAKE 1 TABLET BY MOUTH EVERY NIGHT AT BEDTIME   No facility-administered encounter medications on file as of 04/02/2023.    Surgical History: Past Surgical History:  Procedure Laterality Date   CHEST TUBE INSERTION      Medical History: Past Medical History:  Diagnosis Date   COPD (chronic obstructive pulmonary disease) (HCC)     Hyperlipidemia    Pneumothorax     Family History: Family History  Problem Relation Age of Onset   Cancer Mother    Anxiety disorder Brother    COPD Brother     Social History   Socioeconomic History   Marital status: Married    Spouse name: Not on file   Number of children: Not on file   Years of education: Not on file   Highest education level: Not on file  Occupational History   Not on file  Tobacco Use   Smoking status: Every Day    Current packs/day: 0.50    Average packs/day: 0.5 packs/day for 40.0 years (20.0 ttl pk-yrs)    Types: Cigarettes   Smokeless tobacco: Never   Tobacco comments:    Pt down to 1/2 pk or less daily   Substance and Sexual Activity   Alcohol use: Not Currently   Drug use: Never   Sexual activity: Not on file  Other Topics Concern   Not on file  Social History Narrative   Not on file   Social Drivers of Health   Financial Resource Strain: Not on file  Food Insecurity: Not on file  Transportation Needs: Not on file  Physical Activity: Not on file  Stress: Not on file  Social Connections: Not on file  Intimate Partner Violence: Not on file      Review of Systems  Constitutional:  Negative for chills, fatigue and unexpected weight change.  HENT:  Negative for congestion, postnasal drip,  rhinorrhea, sneezing and sore throat.   Eyes:  Negative for redness.  Respiratory:  Negative for cough, chest tightness, shortness of breath and wheezing.   Cardiovascular:  Negative for chest pain and palpitations.  Gastrointestinal:  Negative for abdominal pain, constipation, diarrhea, nausea and vomiting.  Genitourinary:  Negative for dysuria and frequency.       Nocturia  Musculoskeletal:  Negative for back pain, joint swelling and neck pain.  Skin:  Negative for rash.  Neurological: Negative.  Negative for tremors and numbness.  Hematological:  Negative for adenopathy. Does not bruise/bleed easily.  Psychiatric/Behavioral:  Negative for  behavioral problems (Depression), sleep disturbance and suicidal ideas. The patient is nervous/anxious.     Vital Signs: BP 115/70   Pulse 88   Temp 98.2 F (36.8 C)   Resp 16   Ht 5\' 9"  (1.753 m)   Wt 129 lb 6.4 oz (58.7 kg)   SpO2 94%   BMI 19.11 kg/m    Physical Exam Vitals and nursing note reviewed.  Constitutional:      General: He is not in acute distress.    Appearance: Normal appearance. He is well-developed. He is not diaphoretic.  HENT:     Head: Normocephalic and atraumatic.  Neck:     Thyroid: No thyromegaly.     Vascular: No JVD.     Trachea: No tracheal deviation.  Cardiovascular:     Rate and Rhythm: Normal rate and regular rhythm.     Heart sounds: Normal heart sounds. No murmur heard.    No friction rub. No gallop.  Pulmonary:     Effort: Pulmonary effort is normal. No respiratory distress.     Breath sounds: No wheezing.  Chest:     Chest wall: No tenderness.  Skin:    General: Skin is dry.  Neurological:     Mental Status: He is alert and oriented to person, place, and time.     Cranial Nerves: No cranial nerve deficit.  Psychiatric:        Behavior: Behavior normal.        Thought Content: Thought content normal.        Judgment: Judgment normal.        Assessment/Plan: 1. 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 Nocturia 2x/night and will monitor. Has labs including PSA ordered. Will limit fluids at night. May need urology evaluation in future   General Counseling: Chase Burnett understanding of the findings of todays visit and agrees with plan of treatment. I have discussed any further diagnostic evaluation that may be needed or ordered today. We also reviewed his medications today. he has been encouraged to call the office with any questions or concerns that should arise related to todays visit.    No orders of the  defined types were placed in this encounter.   No orders of the defined types were placed in this encounter.   This patient was seen by Lynn Ito, PA-C in collaboration with Dr. Beverely Risen as a part of collaborative care agreement.   Total time spent:30 Minutes Time spent includes review of chart, medications, test results, and follow up plan with the patient.      Dr Lyndon Code Internal medicine

## 2023-04-07 ENCOUNTER — Ambulatory Visit (INDEPENDENT_AMBULATORY_CARE_PROVIDER_SITE_OTHER): Payer: PPO | Admitting: Internal Medicine

## 2023-04-07 ENCOUNTER — Encounter: Payer: Self-pay | Admitting: Internal Medicine

## 2023-04-07 VITALS — BP 120/68 | HR 86 | Temp 98.6°F | Resp 16 | Ht 69.0 in | Wt 129.0 lb

## 2023-04-07 DIAGNOSIS — R918 Other nonspecific abnormal finding of lung field: Secondary | ICD-10-CM | POA: Diagnosis not present

## 2023-04-07 DIAGNOSIS — J449 Chronic obstructive pulmonary disease, unspecified: Secondary | ICD-10-CM | POA: Diagnosis not present

## 2023-04-07 NOTE — Progress Notes (Unsigned)
 [facility] [address] [place], [place] [address]  Pulmonary Sleep Medicine   Office Visit Note  Patient Name: Chase Burnett DOB: [birth year] MRN [id number]  Date of Service: 04/07/2023  Complaints/HPI: He is here for follow up for PET scan. On the current scan overall no worrisome lesion that are significant. He is still smoking now interested in smoking cessation. We did discuss different measure to quit smoking.   Office Spirometry Results:     ROS  General: (-) fever, (-) chills, (-) night sweats, (-) weakness Skin: (-) rashes, (-) itching,. Eyes: (-) visual changes, (-) redness, (-) itching. Nose and Sinuses: (-) nasal stuffiness or itchiness, (-) postnasal drip, (-) nosebleeds, (-) sinus trouble. Mouth and Throat: (-) sore throat, (-) hoarseness. Neck: (-) swollen glands, (-) enlarged thyroid, (-) neck pain. Respiratory: - cough, (-) bloody sputum, - shortness of breath, - wheezing. Cardiovascular: - ankle swelling, (-) chest pain. Lymphatic: (-) lymph node enlargement. Neurologic: (-) numbness, (-) tingling. Psychiatric: (-) anxiety, (-) depression   Current Medication: Outpatient Encounter Medications as of 04/07/2023  Medication Sig   albuterol (VENTOLIN HFA) 108 (90 Base) MCG/ACT inhaler INHALE 2 PUFFS INTO THE LUNGS EVERY 6 HOURS AS NEEDED FOR WHEEZING OR SHORTNESS OF BREATH   azelastine (ASTELIN) 0.1 % nasal spray Place 1 spray into both nostrils daily. Use in each nostril as directed   clonazePAM (KLONOPIN) 0.5 MG tablet Take 1/2-1 tablet by mouth daily as needed   Fluticasone-Umeclidin-Vilant (TRELEGY ELLIPTA) 100-62.5-25 MCG/ACT AEPB Inhale 1 puff into the lungs daily.   ipratropium-albuterol (DUONEB) 0.5-2.5 (3) MG/3ML SOLN Take 3 mLs by nebulization every 6 (six) hours as needed.   traZODone (DESYREL) 50 MG tablet TAKE 1 TABLET BY MOUTH EVERY NIGHT AT BEDTIME   No facility-administered encounter medications on file as of 04/07/2023.     Surgical History: Past Surgical History:  Procedure Laterality Date   CHEST TUBE INSERTION      Medical History: Past Medical History:  Diagnosis Date   COPD (chronic obstructive pulmonary disease) (HCC)    Hyperlipidemia    Pneumothorax     Family History: Family History  Problem Relation Age of Onset   Cancer Mother    Anxiety disorder Brother    COPD Brother     Social History: Social History   Socioeconomic History   Marital status: Married    Spouse name: Not on file   Number of children: Not on file   Years of education: Not on file   Highest education level: Not on file  Occupational History   Not on file  Tobacco Use   Smoking status: Every Day    Current packs/day: 0.50    Average packs/day: 0.5 packs/day for 40.0 years (20.0 ttl pk-yrs)    Types: Cigarettes   Smokeless tobacco: Never   Tobacco comments:    Pt down to 1/2 pk or less daily   Substance and Sexual Activity   Alcohol use: Not Currently   Drug use: Never   Sexual activity: Not on file  Other Topics Concern   Not on file  Social History Narrative   Not on file   Social Drivers of Health   Financial Resource Strain: Not on file  Food Insecurity: Not on file  Transportation Needs: Not on file  Physical Activity: Not on file  Stress: Not on file  Social Connections: Not on file  Intimate Partner Violence: Not on file    Vital Signs: Blood pressure 120/68, pulse 86,  temperature 98.6 F (37 C), resp. rate 16, height 5\' 9"  (1.753 m), weight 129 lb (58.5 kg), SpO2 94%.  Examination: General Appearance: The patient is well-developed, well-nourished, and in no distress. Skin: Gross inspection of skin unremarkable. Head: normocephalic, no gross deformities. Eyes: no gross deformities noted. ENT: ears appear grossly normal no exudates. Neck: Supple. No thyromegaly. No LAD. Respiratory: no rhonchi noted. Cardiovascular: Normal S1 and S2 without murmur or rub. Extremities: No  cyanosis. pulses are equal. Neurologic: Alert and oriented. No involuntary movements.  LABS: Recent Results (from the past 2160 hours)  Glucose, capillary     Status: None   Collection Time: 03/11/23  8:10 AM  Result Value Ref Range   Glucose-Capillary 84 70 - 99 mg/dL    Comment: Glucose reference range applies only to samples taken after fasting for at least 8 hours.    Radiology: NM PET Image Restage (PS) Skull Base to Thigh (F-18 FDG) Result Date: 03/30/2023 CLINICAL DATA:  Initial treatment strategy for pulmonary nodules. EXAM: NUCLEAR MEDICINE PET SKULL BASE TO THIGH TECHNIQUE: 7.1 mCi F-18 FDG was injected intravenously. Full-ring PET imaging was performed from the skull base to thigh after the radiotracer. CT data was obtained and used for attenuation correction and anatomic localization. Fasting blood glucose: 84 mg/dl COMPARISON:  Chest CT 63/87/5643 FINDINGS: Mediastinal blood pool activity: SUV max 2.0 Liver activity: SUV max NA NECK: No significant abnormal hypermetabolic activity in this region. Incidental CT findings: Bilateral common carotid atheromatous vascular calcification. CHEST: The 4 mm right upper lobe nodule on image 51 series 6 is maximum SUV of 1.7. This lesion is below sensitive PET-CT size thresholds. The 5 mm superior segment left lower lobe nodule on image 60 series 6 has a maximum SUV of 0.7. This nodule is below sensitive PET-CT size thresholds. The nodule of concern on prior screening chest CT 02/06/2023 is difficult to identify on today's CT data but is probably around image 49 of series 6 where there is some faint density in a similar location. Maximum SUV in this region is 0.5 at least based on current appearance this lesion is below sensitive PET-CT size thresholds. Incidental CT findings: Coronary, aortic arch, and branch vessel atherosclerotic vascular disease. Mild airway thickening bilaterally. Centrilobular emphysema. ABDOMEN/PELVIS: No significant abnormal  hypermetabolic activity in this region. Incidental CT findings: Atherosclerosis is present, including aortoiliac atherosclerotic disease. SKELETON: No significant abnormal hypermetabolic activity in this region. Incidental CT findings: Spondylosis particularly in the cervical and lumbar regions. Grade 1 degenerative retrolisthesis at L4-5. IMPRESSION: 1. The left upper lobe nodule of concern on prior screening chest CT 02/06/2023 is difficult to identify on today's CT data but is probably around CT image 49 of series 6 where there is some faint density in a similar location. Maximum SUV in this region is 0.5; at least based on current appearance this lesion is below sensitive PET-CT size thresholds. 2. The 4 mm right upper lobe nodule has a maximum SUV of 1.7. This lesion is below sensitive PET-CT size thresholds. 3. The 5 mm superior segment left lower lobe nodule has a maximum SUV of 0.7. This nodule is below sensitive PET-CT size thresholds. 4. No specific worrisome hypermetabolic lesion is identified. Resumption of lung cancer screening in 6-12 months time is recommended. 5. Mild airway thickening bilaterally. Centrilobular emphysema. 6. Spondylosis particularly in the cervical and lumbar regions. Grade 1 degenerative retrolisthesis at L4-5. Electronically Signed   By: Gaylyn Rong M.D.   On: 03/30/2023 17:10  No results found.  NM PET Image Restage (PS) Skull Base to Thigh (F-18 FDG) Result Date: 03/30/2023 CLINICAL DATA:  Initial treatment strategy for pulmonary nodules. EXAM: NUCLEAR MEDICINE PET SKULL BASE TO THIGH TECHNIQUE: 7.1 mCi F-18 FDG was injected intravenously. Full-ring PET imaging was performed from the skull base to thigh after the radiotracer. CT data was obtained and used for attenuation correction and anatomic localization. Fasting blood glucose: 84 mg/dl COMPARISON:  Chest CT 62/13/0865 FINDINGS: Mediastinal blood pool activity: SUV max 2.0 Liver activity: SUV max NA NECK: No  significant abnormal hypermetabolic activity in this region. Incidental CT findings: Bilateral common carotid atheromatous vascular calcification. CHEST: The 4 mm right upper lobe nodule on image 51 series 6 is maximum SUV of 1.7. This lesion is below sensitive PET-CT size thresholds. The 5 mm superior segment left lower lobe nodule on image 60 series 6 has a maximum SUV of 0.7. This nodule is below sensitive PET-CT size thresholds. The nodule of concern on prior screening chest CT 02/06/2023 is difficult to identify on today's CT data but is probably around image 49 of series 6 where there is some faint density in a similar location. Maximum SUV in this region is 0.5 at least based on current appearance this lesion is below sensitive PET-CT size thresholds. Incidental CT findings: Coronary, aortic arch, and branch vessel atherosclerotic vascular disease. Mild airway thickening bilaterally. Centrilobular emphysema. ABDOMEN/PELVIS: No significant abnormal hypermetabolic activity in this region. Incidental CT findings: Atherosclerosis is present, including aortoiliac atherosclerotic disease. SKELETON: No significant abnormal hypermetabolic activity in this region. Incidental CT findings: Spondylosis particularly in the cervical and lumbar regions. Grade 1 degenerative retrolisthesis at L4-5. IMPRESSION: 1. The left upper lobe nodule of concern on prior screening chest CT 02/06/2023 is difficult to identify on today's CT data but is probably around CT image 49 of series 6 where there is some faint density in a similar location. Maximum SUV in this region is 0.5; at least based on current appearance this lesion is below sensitive PET-CT size thresholds. 2. The 4 mm right upper lobe nodule has a maximum SUV of 1.7. This lesion is below sensitive PET-CT size thresholds. 3. The 5 mm superior segment left lower lobe nodule has a maximum SUV of 0.7. This nodule is below sensitive PET-CT size thresholds. 4. No specific  worrisome hypermetabolic lesion is identified. Resumption of lung cancer screening in 6-12 months time is recommended. 5. Mild airway thickening bilaterally. Centrilobular emphysema. 6. Spondylosis particularly in the cervical and lumbar regions. Grade 1 degenerative retrolisthesis at L4-5. Electronically Signed   By: Gaylyn Rong M.D.   On: 03/30/2023 17:10    Assessment and Plan: Patient Active Problem List   Diagnosis Date Noted   Chronic obstructive pulmonary disease (HCC) 01/11/2020   Dyspnea on exertion 01/11/2020   Screening for lung cancer 01/11/2020   Moderate cigarette smoker 01/11/2020   Coronary artery disease involving native coronary artery of native heart without angina pectoris 01/11/2020   Aortic atherosclerosis (HCC) 01/11/2020   Generalized anxiety disorder 01/11/2020   Other fatigue 01/11/2020   Screening for colon cancer 01/11/2020   Respiratory failure with hypoxia (HCC) 05/24/2017    1. Multiple lung nodules on CT (Primary) *** - CT CHEST LUNG CA SCREEN LOW DOSE W/O CM; Future  2. Chronic obstructive pulmonary disease, unspecified COPD type (HCC) *** - Pulmonary function test; Future   General Counseling: I have discussed the findings of the evaluation and examination with Jeannett Senior.  I have also  discussed any further diagnostic evaluation thatmay be needed or ordered today. Chibuike verbalizes understanding of the findings of todays visit. We also reviewed his medications today and discussed drug interactions and side effects including but not limited excessive drowsiness and altered mental states. We also discussed that there is always a risk not just to him but also people around him. he has been encouraged to call the office with any questions or concerns that should arise related to todays visit.  No orders of the defined types were placed in this encounter.    Time spent: 64  I have personally obtained a history, examined the patient, evaluated  laboratory and imaging results, formulated the assessment and plan and placed orders.    Yevonne Pax, MD [facility] Pulmonary and Critical Care Sleep medicine

## 2023-04-22 DIAGNOSIS — J449 Chronic obstructive pulmonary disease, unspecified: Secondary | ICD-10-CM | POA: Diagnosis not present

## 2023-05-22 DIAGNOSIS — J449 Chronic obstructive pulmonary disease, unspecified: Secondary | ICD-10-CM | POA: Diagnosis not present

## 2023-06-11 ENCOUNTER — Other Ambulatory Visit: Payer: Self-pay

## 2023-06-11 ENCOUNTER — Telehealth: Payer: Self-pay

## 2023-06-11 DIAGNOSIS — F411 Generalized anxiety disorder: Secondary | ICD-10-CM

## 2023-06-11 MED ORDER — AZELASTINE HCL 0.1 % NA SOLN: 1.0000 | Freq: Every day | NASAL | 1 refills | Status: AC

## 2023-06-11 MED ORDER — CLONAZEPAM 0.5 MG PO TABS
ORAL_TABLET | ORAL | 0 refills | Status: DC
Start: 2023-06-11 — End: 2023-07-02

## 2023-06-11 NOTE — Telephone Encounter (Signed)
 Pt notified that we sent med until next appt

## 2023-06-18 ENCOUNTER — Telehealth: Payer: Self-pay

## 2023-06-18 ENCOUNTER — Other Ambulatory Visit: Payer: Self-pay

## 2023-06-18 MED ORDER — AZITHROMYCIN 250 MG PO TABS: ORAL_TABLET | ORAL | 0 refills | Status: AC

## 2023-06-18 NOTE — Telephone Encounter (Signed)
 Patient notified

## 2023-06-22 DIAGNOSIS — J449 Chronic obstructive pulmonary disease, unspecified: Secondary | ICD-10-CM | POA: Diagnosis not present

## 2023-07-02 ENCOUNTER — Encounter: Payer: Self-pay | Admitting: Physician Assistant

## 2023-07-02 ENCOUNTER — Ambulatory Visit (INDEPENDENT_AMBULATORY_CARE_PROVIDER_SITE_OTHER): Admitting: Physician Assistant

## 2023-07-02 VITALS — BP 105/70 | HR 91 | Temp 98.2°F | Resp 16 | Ht 69.0 in | Wt 130.8 lb

## 2023-07-02 DIAGNOSIS — F411 Generalized anxiety disorder: Secondary | ICD-10-CM | POA: Diagnosis not present

## 2023-07-02 DIAGNOSIS — J449 Chronic obstructive pulmonary disease, unspecified: Secondary | ICD-10-CM

## 2023-07-02 DIAGNOSIS — F17219 Nicotine dependence, cigarettes, with unspecified nicotine-induced disorders: Secondary | ICD-10-CM | POA: Diagnosis not present

## 2023-07-02 MED ORDER — TRELEGY ELLIPTA 100-62.5-25 MCG/ACT IN AEPB
1.0000 | INHALATION_SPRAY | Freq: Every day | RESPIRATORY_TRACT | 3 refills | Status: DC
Start: 2023-07-02 — End: 2023-09-28

## 2023-07-02 MED ORDER — CLONAZEPAM 0.5 MG PO TABS
ORAL_TABLET | ORAL | 1 refills | Status: DC
Start: 2023-07-02 — End: 2023-09-28

## 2023-07-02 MED ORDER — TRAZODONE HCL 50 MG PO TABS: 50.0000 mg | ORAL_TABLET | Freq: Every day | ORAL | 1 refills | Status: DC

## 2023-07-02 NOTE — Progress Notes (Signed)
 [facility] [address] [place], [place] [address]  Internal MEDICINE  Office Visit Note  Patient Name: Chase Burnett  [id number]  [id number]  Date of Service: 07/02/2023  Chief Complaint  Patient presents with   Follow-up   Hyperlipidemia   Medication Refill    HPI Pt is here for routine follow up -Doing better since zpak -still working to quit smoking, 1/2ppd. Did not bring any here or to work, so trying to limit.  -using neb at times, has not used trelegy yet today and is wheezing in office. He states he uses it every day at 2:30pm -doing well with 1/2 tab klonopin  as needed, still has some at home but will need refill in future and will send -some right knee pain at times and is going to try voltaren  -still needs labs and will go now, has lab slip still  Current Medication: Outpatient Encounter Medications as of 07/02/2023  Medication Sig   albuterol  (VENTOLIN  HFA) 108 (90 Base) MCG/ACT inhaler INHALE 2 PUFFS INTO THE LUNGS EVERY 6 HOURS AS NEEDED FOR WHEEZING OR SHORTNESS OF BREATH   azelastine  (ASTELIN ) 0.1 % nasal spray Place 1 spray into both nostrils daily. Use in each nostril as directed   ipratropium-albuterol  (DUONEB) 0.5-2.5 (3) MG/3ML SOLN Take 3 mLs by nebulization every 6 (six) hours as needed.   [DISCONTINUED] clonazePAM  (KLONOPIN ) 0.5 MG tablet Take 1/2-1 tablet by mouth daily as needed   [DISCONTINUED] Fluticasone -Umeclidin-Vilant (TRELEGY ELLIPTA ) 100-62.5-25 MCG/ACT AEPB Inhale 1 puff into the lungs daily.   [DISCONTINUED] traZODone  (DESYREL ) 50 MG tablet TAKE 1 TABLET BY MOUTH EVERY NIGHT AT BEDTIME   clonazePAM  (KLONOPIN ) 0.5 MG tablet Take 1/2-1 tablet by mouth daily as needed   Fluticasone -Umeclidin-Vilant (TRELEGY ELLIPTA ) 100-62.5-25 MCG/ACT AEPB Inhale 1 puff into the lungs daily.   traZODone  (DESYREL ) 50 MG tablet Take 1 tablet (50 mg total) by mouth at bedtime.   No facility-administered encounter medications on file as of  07/02/2023.    Surgical History: Past Surgical History:  Procedure Laterality Date   CHEST TUBE INSERTION      Medical History: Past Medical History:  Diagnosis Date   COPD (chronic obstructive pulmonary disease) (HCC)    Hyperlipidemia    Pneumothorax     Family History: Family History  Problem Relation Age of Onset   Cancer Mother    Anxiety disorder Brother    COPD Brother     Social History   Socioeconomic History   Marital status: Married    Spouse name: Not on file   Number of children: Not on file   Years of education: Not on file   Highest education level: Not on file  Occupational History   Not on file  Tobacco Use   Smoking status: Every Day    Current packs/day: 0.50    Average packs/day: 0.5 packs/day for 40.0 years (20.0 ttl pk-yrs)    Types: Cigarettes   Smokeless tobacco: Never   Tobacco comments:    Pt down to 1/2 pk or less daily   Substance and Sexual Activity   Alcohol use: Not Currently   Drug use: Never   Sexual activity: Not on file  Other Topics Concern   Not on file  Social History Narrative   Not on file   Social Drivers of Health   Financial Resource Strain: Not on file  Food Insecurity: Not on file  Transportation Needs: Not on file  Physical Activity: Not on file  Stress: Not  on file  Social Connections: Not on file  Intimate Partner Violence: Not on file      Review of Systems  Constitutional:  Negative for chills, fatigue and unexpected weight change.  HENT:  Negative for congestion, postnasal drip, rhinorrhea, sneezing and sore throat.   Eyes:  Negative for redness.  Respiratory:  Positive for cough and wheezing. Negative for chest tightness and shortness of breath.   Cardiovascular:  Negative for chest pain and palpitations.  Gastrointestinal:  Negative for abdominal pain, constipation, diarrhea, nausea and vomiting.  Genitourinary:  Negative for dysuria and frequency.  Musculoskeletal:  Negative for back pain,  joint swelling and neck pain.  Skin:  Negative for rash.  Neurological: Negative.  Negative for tremors and numbness.  Hematological:  Negative for adenopathy. Does not bruise/bleed easily.  Psychiatric/Behavioral:  Negative for behavioral problems (Depression), sleep disturbance and suicidal ideas. The patient is nervous/anxious.     Vital Signs: BP 105/70   Pulse 91   Temp 98.2 F (36.8 C)   Resp 16   Ht 5' 9 (1.753 m)   Wt 130 lb 12.8 oz (59.3 kg)   SpO2 95%   BMI 19.32 kg/m    Physical Exam Vitals and nursing note reviewed.  Constitutional:      General: He is not in acute distress.    Appearance: Normal appearance. He is well-developed. He is not diaphoretic.  HENT:     Head: Normocephalic and atraumatic.  Neck:     Thyroid : No thyromegaly.     Vascular: No JVD.     Trachea: No tracheal deviation.   Cardiovascular:     Rate and Rhythm: Normal rate and regular rhythm.     Heart sounds: Normal heart sounds. No murmur heard.    No friction rub. No gallop.  Pulmonary:     Effort: Pulmonary effort is normal. No respiratory distress.     Breath sounds: Wheezing and rhonchi present.  Chest:     Chest wall: No tenderness.   Skin:    General: Skin is dry.   Neurological:     Mental Status: He is alert and oriented to person, place, and time.   Psychiatric:        Behavior: Behavior normal.        Thought Content: Thought content normal.        Judgment: Judgment normal.        Assessment/Plan: 1. Generalized anxiety disorder May continue klonopin  as needed - clonazePAM  (KLONOPIN ) 0.5 MG tablet; Take 1/2-1 tablet by mouth daily as needed  Dispense: 30 tablet; Refill: 1 - traZODone  (DESYREL ) 50 MG tablet; Take 1 tablet (50 mg total) by mouth at bedtime.  Dispense: 90 tablet; Refill: 1  2. Chronic obstructive pulmonary disease, unspecified COPD type (HCC) Continue trelegy, followed by pulmonology - Fluticasone -Umeclidin-Vilant (TRELEGY ELLIPTA ) 100-62.5-25  MCG/ACT AEPB; Inhale 1 puff into the lungs daily.  Dispense: 60 each; Refill: 3  3. 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 I have discussed any further diagnostic evaluation that may be needed or ordered today. We also reviewed his medications today. he has been encouraged to call the office with any questions or concerns that should arise related to todays visit.    No orders of the defined types were placed in this encounter.   Meds ordered this encounter  Medications   clonazePAM  (KLONOPIN )  0.5 MG tablet    Sig: Take 1/2-1 tablet by mouth daily as needed    Dispense:  30 tablet    Refill:  1   traZODone  (DESYREL ) 50 MG tablet    Sig: Take 1 tablet (50 mg total) by mouth at bedtime.    Dispense:  90 tablet    Refill:  1   Fluticasone -Umeclidin-Vilant (TRELEGY ELLIPTA ) 100-62.5-25 MCG/ACT AEPB    Sig: Inhale 1 puff into the lungs daily.    Dispense:  60 each    Refill:  3    This patient was seen by Tinnie Pro, PA-C in collaboration with Dr. Sigrid Bathe as a part of collaborative care agreement.   Total time spent:30 Minutes Time spent includes review of chart, medications, test results, and follow up plan with the patient.      Dr Fozia M Khan Internal medicine

## 2023-07-22 DIAGNOSIS — J449 Chronic obstructive pulmonary disease, unspecified: Secondary | ICD-10-CM | POA: Diagnosis not present

## 2023-07-29 DIAGNOSIS — R7989 Other specified abnormal findings of blood chemistry: Secondary | ICD-10-CM | POA: Diagnosis not present

## 2023-07-29 DIAGNOSIS — D510 Vitamin B12 deficiency anemia due to intrinsic factor deficiency: Secondary | ICD-10-CM | POA: Diagnosis not present

## 2023-07-29 DIAGNOSIS — E782 Mixed hyperlipidemia: Secondary | ICD-10-CM | POA: Diagnosis not present

## 2023-07-29 DIAGNOSIS — R5383 Other fatigue: Secondary | ICD-10-CM | POA: Diagnosis not present

## 2023-07-29 DIAGNOSIS — Z125 Encounter for screening for malignant neoplasm of prostate: Secondary | ICD-10-CM | POA: Diagnosis not present

## 2023-07-30 ENCOUNTER — Ambulatory Visit: Payer: Self-pay | Admitting: Physician Assistant

## 2023-07-30 LAB — CBC WITH DIFFERENTIAL/PLATELET
Basophils Absolute: 0.1 x10E3/uL (ref 0.0–0.2)
Basos: 1 %
EOS (ABSOLUTE): 0.2 x10E3/uL (ref 0.0–0.4)
Eos: 3 %
Hematocrit: 43.5 % (ref 37.5–51.0)
Hemoglobin: 14.8 g/dL (ref 13.0–17.7)
Immature Grans (Abs): 0 x10E3/uL (ref 0.0–0.1)
Immature Granulocytes: 0 %
Lymphocytes Absolute: 1.5 x10E3/uL (ref 0.7–3.1)
Lymphs: 32 %
MCH: 33.3 pg — ABNORMAL HIGH (ref 26.6–33.0)
MCHC: 34 g/dL (ref 31.5–35.7)
MCV: 98 fL — ABNORMAL HIGH (ref 79–97)
Monocytes Absolute: 0.5 x10E3/uL (ref 0.1–0.9)
Monocytes: 10 %
Neutrophils Absolute: 2.5 x10E3/uL (ref 1.4–7.0)
Neutrophils: 54 %
Platelets: 253 x10E3/uL (ref 150–450)
RBC: 4.45 x10E6/uL (ref 4.14–5.80)
RDW: 12.1 % (ref 11.6–15.4)
WBC: 4.6 x10E3/uL (ref 3.4–10.8)

## 2023-07-30 LAB — COMPREHENSIVE METABOLIC PANEL WITH GFR
ALT: 12 IU/L (ref 0–44)
AST: 16 IU/L (ref 0–40)
Albumin: 4.5 g/dL (ref 3.9–4.9)
Alkaline Phosphatase: 80 IU/L (ref 44–121)
BUN/Creatinine Ratio: 18 (ref 10–24)
BUN: 14 mg/dL (ref 8–27)
Bilirubin Total: 0.6 mg/dL (ref 0.0–1.2)
CO2: 24 mmol/L (ref 20–29)
Calcium: 9.9 mg/dL (ref 8.6–10.2)
Chloride: 101 mmol/L (ref 96–106)
Creatinine, Ser: 0.79 mg/dL (ref 0.76–1.27)
Globulin, Total: 2.3 g/dL (ref 1.5–4.5)
Glucose: 95 mg/dL (ref 70–99)
Potassium: 4.9 mmol/L (ref 3.5–5.2)
Sodium: 139 mmol/L (ref 134–144)
Total Protein: 6.8 g/dL (ref 6.0–8.5)
eGFR: 97 mL/min/1.73 (ref >59)

## 2023-07-30 LAB — LIPID PANEL WITH LDL/HDL RATIO
Cholesterol, Total: 176 mg/dL (ref 100–199)
HDL: 69 mg/dL (ref >39)
LDL Chol Calc (NIH): 98 mg/dL (ref 0–99)
LDL/HDL Ratio: 1.4 ratio (ref 0.0–3.6)
Triglycerides: 45 mg/dL (ref 0–149)
VLDL Cholesterol Cal: 9 mg/dL (ref 5–40)

## 2023-07-30 LAB — TSH+FREE T4
Free T4: 1.6 ng/dL (ref 0.82–1.77)
TSH: 1.75 u[IU]/mL (ref 0.450–4.500)

## 2023-07-30 LAB — PSA TOTAL (REFLEX TO FREE): Prostate Specific Ag, Serum: 0.4 ng/mL (ref 0.0–4.0)

## 2023-08-03 LAB — B12 AND FOLATE PANEL
Folate: >20 ng/mL (ref >3.0)
Vitamin B-12: 369 pg/mL (ref 232–1245)

## 2023-08-03 LAB — SPECIMEN STATUS REPORT

## 2023-08-22 DIAGNOSIS — J449 Chronic obstructive pulmonary disease, unspecified: Secondary | ICD-10-CM | POA: Diagnosis not present

## 2023-09-10 ENCOUNTER — Other Ambulatory Visit: Payer: Self-pay | Admitting: Physician Assistant

## 2023-09-10 ENCOUNTER — Telehealth: Payer: Self-pay | Admitting: Physician Assistant

## 2023-09-10 ENCOUNTER — Telehealth: Payer: Self-pay

## 2023-09-10 ENCOUNTER — Telehealth: Payer: Self-pay | Admitting: Internal Medicine

## 2023-09-10 NOTE — Telephone Encounter (Signed)
 Notified patient of CT appointment date, arrival time, location-[place]

## 2023-09-10 NOTE — Progress Notes (Signed)
 He has a refill available still

## 2023-09-10 NOTE — Telephone Encounter (Signed)
 error

## 2023-09-10 NOTE — Telephone Encounter (Signed)
Spoke with patient regarding labs.

## 2023-09-17 ENCOUNTER — Ambulatory Visit
Admission: RE | Admit: 2023-09-17 | Discharge: 2023-09-17 | Disposition: A | Discharge status: Home / Self Care | Source: Ambulatory Visit | Attending: Internal Medicine | Admitting: Internal Medicine

## 2023-09-17 DIAGNOSIS — R918 Other nonspecific abnormal finding of lung field: Secondary | ICD-10-CM | POA: Insufficient documentation

## 2023-09-17 DIAGNOSIS — J432 Centrilobular emphysema: Secondary | ICD-10-CM | POA: Diagnosis not present

## 2023-09-17 DIAGNOSIS — I700 Atherosclerosis of aorta: Secondary | ICD-10-CM | POA: Diagnosis not present

## 2023-09-22 DIAGNOSIS — J449 Chronic obstructive pulmonary disease, unspecified: Secondary | ICD-10-CM | POA: Diagnosis not present

## 2023-09-28 ENCOUNTER — Encounter: Payer: Self-pay | Admitting: Physician Assistant

## 2023-09-28 ENCOUNTER — Ambulatory Visit: Admitting: Physician Assistant

## 2023-09-28 VITALS — BP 123/69 | HR 83 | Temp 98.0°F | Resp 16 | Ht 69.0 in | Wt 132.0 lb

## 2023-09-28 DIAGNOSIS — E538 Deficiency of other specified B group vitamins: Secondary | ICD-10-CM | POA: Diagnosis not present

## 2023-09-28 DIAGNOSIS — F411 Generalized anxiety disorder: Secondary | ICD-10-CM

## 2023-09-28 DIAGNOSIS — J449 Chronic obstructive pulmonary disease, unspecified: Secondary | ICD-10-CM | POA: Diagnosis not present

## 2023-09-28 MED ORDER — TRELEGY ELLIPTA 100-62.5-25 MCG/ACT IN AEPB: 1.0000 | INHALATION_SPRAY | Freq: Every day | RESPIRATORY_TRACT | 3 refills | Status: DC

## 2023-09-28 MED ORDER — ALBUTEROL SULFATE HFA 108 (90 BASE) MCG/ACT IN AERS: 2.0000 | INHALATION_SPRAY | Freq: Four times a day (QID) | RESPIRATORY_TRACT | 3 refills | Status: AC | PRN

## 2023-09-28 MED ORDER — CYANOCOBALAMIN 1000 MCG/ML IJ SOLN
1000.0000 ug | Freq: Once | INTRAMUSCULAR | Status: AC
  Administered 2023-09-28: 1000 ug via INTRAMUSCULAR

## 2023-09-28 MED ORDER — CLONAZEPAM 0.5 MG PO TABS: ORAL_TABLET | ORAL | 1 refills | Status: DC

## 2023-09-28 MED ORDER — TRAZODONE HCL 50 MG PO TABS
50.0000 mg | ORAL_TABLET | Freq: Every day | ORAL | 1 refills | Status: AC
Start: 2023-09-28 — End: ?

## 2023-09-28 NOTE — Progress Notes (Signed)
 [facility] [address] [place], [place] [address]  Internal MEDICINE  Office Visit Note  Patient Name: Chase Burnett  [id number]  [id number]  Date of Service: 09/28/2023  Chief Complaint  Patient presents with   Follow-up    Lab review   Hyperlipidemia   Medication Refill    HPI Pt is here for routine follow up -Working about 4 hours per day most days, though timing varies -labs reviewed: B12 a little low and will start injections. -taking 1/2 klonopin  daily -using neb off and on -followed by pulmonology who are monitoring pulm nodules  Current Medication: Outpatient Encounter Medications as of 09/28/2023  Medication Sig   albuterol  (VENTOLIN  HFA) 108 (90 Base) MCG/ACT inhaler INHALE 2 PUFFS INTO THE LUNGS EVERY 6 HOURS AS NEEDED FOR WHEEZING OR SHORTNESS OF BREATH   azelastine  (ASTELIN ) 0.1 % nasal spray Place 1 spray into both nostrils daily. Use in each nostril as directed   clonazePAM  (KLONOPIN ) 0.5 MG tablet Take 1/2-1 tablet by mouth daily as needed   Fluticasone -Umeclidin-Vilant (TRELEGY ELLIPTA ) 100-62.5-25 MCG/ACT AEPB Inhale 1 puff into the lungs daily.   ipratropium-albuterol  (DUONEB) 0.5-2.5 (3) MG/3ML SOLN Take 3 mLs by nebulization every 6 (six) hours as needed.   traZODone  (DESYREL ) 50 MG tablet Take 1 tablet (50 mg total) by mouth at bedtime.   No facility-administered encounter medications on file as of 09/28/2023.    Surgical History: Past Surgical History:  Procedure Laterality Date   CHEST TUBE INSERTION      Medical History: Past Medical History:  Diagnosis Date   COPD (chronic obstructive pulmonary disease) (HCC)    Hyperlipidemia    Pneumothorax     Family History: Family History  Problem Relation Age of Onset   Cancer Mother    Anxiety disorder Brother    COPD Brother     Social History   Socioeconomic History   Marital status: Married    Spouse name: Not on file   Number of children: Not on file   Years of  education: Not on file   Highest education level: Not on file  Occupational History   Not on file  Tobacco Use   Smoking status: Every Day    Current packs/day: 0.50    Average packs/day: 0.5 packs/day for 40.0 years (20.0 ttl pk-yrs)    Types: Cigarettes   Smokeless tobacco: Never   Tobacco comments:    Pt down to 1/2 pk or less daily   Substance and Sexual Activity   Alcohol use: Not Currently   Drug use: Never   Sexual activity: Not on file  Other Topics Concern   Not on file  Social History Narrative   Not on file   Social Drivers of Health   Financial Resource Strain: Not on file  Food Insecurity: Not on file  Transportation Needs: Not on file  Physical Activity: Not on file  Stress: Not on file  Social Connections: Not on file  Intimate Partner Violence: Not on file      Review of Systems  Constitutional:  Negative for chills, fatigue and unexpected weight change.  HENT:  Negative for congestion, postnasal drip, rhinorrhea, sneezing and sore throat.   Eyes:  Negative for redness.  Respiratory:  Negative for chest tightness and shortness of breath.   Cardiovascular:  Negative for chest pain and palpitations.  Gastrointestinal:  Negative for abdominal pain, constipation, diarrhea, nausea and vomiting.  Genitourinary:  Negative for dysuria and frequency.  Musculoskeletal:  Negative  for back pain, joint swelling and neck pain.  Skin:  Negative for rash.  Neurological: Negative.  Negative for tremors and numbness.  Hematological:  Negative for adenopathy. Does not bruise/bleed easily.  Psychiatric/Behavioral:  Negative for behavioral problems (Depression), sleep disturbance and suicidal ideas. The patient is nervous/anxious.     Vital Signs: BP 123/69   Pulse 83   Temp 98 F (36.7 C)   Resp 16   Ht 5' 9 (1.753 m)   Wt 132 lb (59.9 kg)   SpO2 94%   BMI 19.49 kg/m    Physical Exam Vitals and nursing note reviewed.  Constitutional:      General: He is  not in acute distress.    Appearance: Normal appearance. He is well-developed. He is not diaphoretic.  HENT:     Head: Normocephalic and atraumatic.  Neck:     Thyroid : No thyromegaly.     Vascular: No JVD.     Trachea: No tracheal deviation.  Cardiovascular:     Rate and Rhythm: Normal rate and regular rhythm.     Heart sounds: Normal heart sounds. No murmur heard.    No friction rub. No gallop.  Pulmonary:     Effort: Pulmonary effort is normal. No respiratory distress.     Breath sounds: No wheezing.  Chest:     Chest wall: No tenderness.  Skin:    General: Skin is dry.  Neurological:     Mental Status: He is alert and oriented to person, place, and time.     Cranial Nerves: No cranial nerve deficit.  Psychiatric:        Behavior: Behavior normal.        Thought Content: Thought content normal.        Judgment: Judgment normal.        Assessment/Plan: 1. Chronic obstructive pulmonary disease, unspecified COPD type (HCC) (Primary) Continue trelegy daily and will follow up with pulmonology as planned - Fluticasone -Umeclidin-Vilant (TRELEGY ELLIPTA ) 100-62.5-25 MCG/ACT AEPB; Inhale 1 puff into the lungs daily.  Dispense: 60 each; Refill: 3 - albuterol  (VENTOLIN  HFA) 108 (90 Base) MCG/ACT inhaler; Inhale 2 puffs into the lungs every 6 (six) hours as needed for wheezing or shortness of breath.  Dispense: 6.7 g; Refill: 3  2. Generalized anxiety disorder May continue 1/2 klonopin  as needed and trazodone  nightly - clonazePAM  (KLONOPIN ) 0.5 MG tablet; Take 1/2-1 tablet by mouth daily as needed  Dispense: 30 tablet; Refill: 1 - traZODone  (DESYREL ) 50 MG tablet; Take 1 tablet (50 mg total) by mouth at bedtime.  Dispense: 90 tablet; Refill: 1  3. B12 deficiency - cyanocobalamin  (VITAMIN B12) injection 1,000 mcg   General Counseling: Chase Burnett understanding of the findings of todays visit and agrees with plan of treatment. I have discussed any further diagnostic  evaluation that may be needed or ordered today. We also reviewed his medications today. he has been encouraged to call the office with any questions or concerns that should arise related to todays visit.    No orders of the defined types were placed in this encounter.   No orders of the defined types were placed in this encounter.   This patient was seen by Tinnie Pro, PA-C in collaboration with Dr. Sigrid Bathe as a part of collaborative care agreement.   Total time spent:30 Minutes Time spent includes review of chart, medications, test results, and follow up plan with the patient.      Dr Fozia M Khan Internal medicine

## 2023-10-05 ENCOUNTER — Ambulatory Visit (INDEPENDENT_AMBULATORY_CARE_PROVIDER_SITE_OTHER)

## 2023-10-05 DIAGNOSIS — E538 Deficiency of other specified B group vitamins: Secondary | ICD-10-CM

## 2023-10-05 MED ORDER — CYANOCOBALAMIN 1000 MCG/ML IJ SOLN
1000.0000 ug | Freq: Once | INTRAMUSCULAR | Status: AC
  Administered 2023-10-05: 1000 ug via INTRAMUSCULAR

## 2023-10-07 ENCOUNTER — Encounter: Admitting: Internal Medicine

## 2023-10-12 ENCOUNTER — Ambulatory Visit

## 2023-10-12 DIAGNOSIS — E538 Deficiency of other specified B group vitamins: Secondary | ICD-10-CM | POA: Diagnosis not present

## 2023-10-12 MED ORDER — CYANOCOBALAMIN 1000 MCG/ML IJ SOLN
1000.0000 ug | Freq: Once | INTRAMUSCULAR | Status: AC
  Administered 2023-10-12: 1000 ug via INTRAMUSCULAR

## 2023-10-14 ENCOUNTER — Telehealth: Payer: Self-pay | Admitting: Internal Medicine

## 2023-10-14 NOTE — Telephone Encounter (Signed)
 Left vm to confirm 10/21/23 appointment-Toni

## 2023-10-15 ENCOUNTER — Ambulatory Visit: Admitting: Physician Assistant

## 2023-10-21 ENCOUNTER — Ambulatory Visit: Admitting: Internal Medicine

## 2023-10-21 DIAGNOSIS — J449 Chronic obstructive pulmonary disease, unspecified: Secondary | ICD-10-CM

## 2023-10-22 DIAGNOSIS — J449 Chronic obstructive pulmonary disease, unspecified: Secondary | ICD-10-CM | POA: Diagnosis not present

## 2023-10-26 NOTE — Procedures (Signed)
 [facility] [address] [place], [address]    Complete Pulmonary Function Testing Interpretation:  FINDINGS:  Forced vital capacity is severely decreased.  FEV1 is 0.82 L which is 26% of predicted and is severely decreased.  Postbronchodilator there is no significant change in FEV1.  Total lung capacity is normal.  Residual volume is increased FRC is increased.  DLCO was also moderately decreased.  IMPRESSION:  This pulmonary function study is consistent with severe obstructive lung disease clinical correlation is recommended  Chase DELENA Bathe, MD [facility] Pulmonary Critical Care Medicine Sleep Medicine

## 2023-10-28 LAB — PULMONARY FUNCTION TEST

## 2023-11-03 ENCOUNTER — Encounter: Payer: Self-pay | Admitting: Internal Medicine

## 2023-11-03 ENCOUNTER — Ambulatory Visit (INDEPENDENT_AMBULATORY_CARE_PROVIDER_SITE_OTHER): Admitting: Internal Medicine

## 2023-11-03 VITALS — BP 133/87 | HR 85 | Temp 98.0°F | Resp 16 | Ht 69.0 in | Wt 127.2 lb

## 2023-11-03 DIAGNOSIS — R918 Other nonspecific abnormal finding of lung field: Secondary | ICD-10-CM | POA: Diagnosis not present

## 2023-11-03 DIAGNOSIS — F17219 Nicotine dependence, cigarettes, with unspecified nicotine-induced disorders: Secondary | ICD-10-CM | POA: Diagnosis not present

## 2023-11-03 DIAGNOSIS — J449 Chronic obstructive pulmonary disease, unspecified: Secondary | ICD-10-CM | POA: Diagnosis not present

## 2023-11-03 MED ORDER — IPRATROPIUM-ALBUTEROL 0.5-2.5 (3) MG/3ML IN SOLN: 3.0000 mL | Freq: Four times a day (QID) | RESPIRATORY_TRACT | 3 refills | Status: AC | PRN

## 2023-11-03 NOTE — Progress Notes (Signed)
 [facility] [address] [place], [place] [address]  Pulmonary Sleep Medicine   Office Visit Note  Patient Name: Chase Burnett DOB: 12-08-1955 MRN [id number]  Date of Service: 11/03/2023  Complaints/HPI: He is still smoking unfortunately. He had PFT done shows SEVERE COPD with an FEV1 of 26% Once again I had a lengthy discussion with him about smoking cessation. As far as the CT chest it has benign appearance but he should continue to get annual scans. No chest pain no palpitations noted. He does still get significantly short of breath  Office Spirometry Results:     ROS  General: (-) fever, (-) chills, (-) night sweats, (-) weakness Skin: (-) rashes, (-) itching,. Eyes: (-) visual changes, (-) redness, (-) itching. Nose and Sinuses: (-) nasal stuffiness or itchiness, (-) postnasal drip, (-) nosebleeds, (-) sinus trouble. Mouth and Throat: (-) sore throat, (-) hoarseness. Neck: (-) swollen glands, (-) enlarged thyroid , (-) neck pain. Respiratory: - cough, (-) bloody sputum, + shortness of breath, - wheezing. Cardiovascular: - ankle swelling, (-) chest pain. Lymphatic: (-) lymph node enlargement. Neurologic: (-) numbness, (-) tingling. Psychiatric: (-) anxiety, (-) depression   Current Medication: Outpatient Encounter Medications as of 11/03/2023  Medication Sig   albuterol  (VENTOLIN  HFA) 108 (90 Base) MCG/ACT inhaler Inhale 2 puffs into the lungs every 6 (six) hours as needed for wheezing or shortness of breath.   azelastine  (ASTELIN ) 0.1 % nasal spray Place 1 spray into both nostrils daily. Use in each nostril as directed   clonazePAM  (KLONOPIN ) 0.5 MG tablet Take 1/2-1 tablet by mouth daily as needed   Fluticasone -Umeclidin-Vilant (TRELEGY ELLIPTA ) 100-62.5-25 MCG/ACT AEPB Inhale 1 puff into the lungs daily.   traZODone  (DESYREL ) 50 MG tablet Take 1 tablet (50 mg total) by mouth at bedtime.   [DISCONTINUED] ipratropium-albuterol  (DUONEB) 0.5-2.5 (3)  MG/3ML SOLN Take 3 mLs by nebulization every 6 (six) hours as needed.   ipratropium-albuterol  (DUONEB) 0.5-2.5 (3) MG/3ML SOLN Take 3 mLs by nebulization every 6 (six) hours as needed.   No facility-administered encounter medications on file as of 11/03/2023.    Surgical History: Past Surgical History:  Procedure Laterality Date   CHEST TUBE INSERTION      Medical History: Past Medical History:  Diagnosis Date   COPD (chronic obstructive pulmonary disease) (HCC)    Hyperlipidemia    Pneumothorax     Family History: Family History  Problem Relation Age of Onset   Cancer Mother    Anxiety disorder Brother    COPD Brother     Social History: Social History   Socioeconomic History   Marital status: Married    Spouse name: Not on file   Number of children: Not on file   Years of education: Not on file   Highest education level: Not on file  Occupational History   Not on file  Tobacco Use   Smoking status: Every Day    Current packs/day: 0.50    Average packs/day: 0.5 packs/day for 40.0 years (20.0 ttl pk-yrs)    Types: Cigarettes   Smokeless tobacco: Never   Tobacco comments:    Pt down to 1/2 pk or less daily   Substance and Sexual Activity   Alcohol use: Not Currently   Drug use: Never   Sexual activity: Not on file  Other Topics Concern   Not on file  Social History Narrative   Not on file   Social Drivers of Health   Financial Resource Strain: Not on file  Food Insecurity: Not on file  Transportation Needs: Not on file  Physical Activity: Not on file  Stress: Not on file  Social Connections: Not on file  Intimate Partner Violence: Not on file    Vital Signs: Blood pressure 133/87, pulse 85, temperature 98 F (36.7 C), resp. rate 16, height 5' 9 (1.753 m), weight 127 lb 3.2 oz (57.7 kg), SpO2 96%.  Examination: General Appearance: The patient is well-developed, well-nourished, and in no distress. Skin: Gross inspection of skin  unremarkable. Head: normocephalic, no gross deformities. Eyes: no gross deformities noted. ENT: ears appear grossly normal no exudates. Neck: Supple. No thyromegaly. No LAD. Respiratory: no rhonchi noted. Cardiovascular: Normal S1 and S2 without murmur or rub. Extremities: No cyanosis. pulses are equal. Neurologic: Alert and oriented. No involuntary movements.  LABS: Recent Results (from the past 2160 hours)  Pulmonary Function Test     Status: None   Collection Time: 10/28/23  3:38 PM  Result Value Ref Range   FEV1     FVC     FEV1/FVC     TLC     DLCO      Radiology: CT CHEST LCS NODULE F/U LOW DOSE WO CONTRAST Result Date: 09/27/2023 CLINICAL DATA:  Abnormal lung cancer screening CT. Current 20 pack-year smoker. EXAM: CT CHEST WITHOUT CONTRAST FOR LUNG CANCER SCREENING NODULE FOLLOW-UP TECHNIQUE: Multidetector CT imaging of the chest was performed following the standard protocol without IV contrast. RADIATION DOSE REDUCTION: This exam was performed according to the departmental dose-optimization program which includes automated exposure control, adjustment of the mA and/or kV according to patient size and/or use of iterative reconstruction technique. COMPARISON:  PET 03/11/2023 and CT chest 02/06/2023. FINDINGS: Cardiovascular: Atherosclerotic calcification of the aorta, aortic valve and coronary arteries. Heart is at the upper limits of normal in size. No pericardial effusion. Mediastinum/Nodes: No pathologically enlarged mediastinal or axillary lymph nodes. Hilar regions are difficult to definitively evaluate without IV contrast. Esophagus is grossly unremarkable. Lungs/Pleura: Centrilobular and paraseptal emphysema. Scattered mucoid impaction. Previously seen nodular consolidation in the left upper lobe has resolved in the interval. Pulmonary nodules otherwise measure 7.0 mm or less in size, as before. No pleural fluid. Debris in the airway. Upper Abdomen: Punctate left renal stone.  Visualized portions of the liver, adrenal glands, kidneys, spleen, pancreas, stomach and bowel are otherwise grossly unremarkable. No upper abdominal adenopathy. Musculoskeletal: Degenerative changes in the spine.  Osteopenia. IMPRESSION: 1. Lung-RADS 2, benign appearance or behavior. Continue annual screening with low-dose chest CT without contrast in 12 months. 2. Punctate left renal stone. 3. Aortic atherosclerosis (ICD10-I70.0). Coronary artery calcification. 4.  Emphysema (ICD10-J43.9). Electronically Signed   By: Newell Eke M.D.   On: 09/27/2023 15:58    No results found.  No results found.  Assessment and Plan: Patient Active Problem List   Diagnosis Date Noted   Chronic obstructive pulmonary disease (HCC) 01/11/2020   Dyspnea on exertion 01/11/2020   Screening for lung cancer 01/11/2020   Moderate cigarette smoker 01/11/2020   Coronary artery disease involving native coronary artery of native heart without angina pectoris 01/11/2020   Aortic atherosclerosis 01/11/2020   Generalized anxiety disorder 01/11/2020   Other fatigue 01/11/2020   Screening for colon cancer 01/11/2020   Respiratory failure with hypoxia (HCC) 05/24/2017    1. Chronic obstructive pulmonary disease, unspecified COPD type (HCC) (Primary) He has SEVERE disease and needs to stop smoking. We once again had a lengthy discussion and emcouraged him to stop  2. 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 I explained that he may be looking at going on oxygen soon as he continues to smoke  3. Multiple lung nodules on CT Benign appearance will get follow up scan in 1 year   General Counseling: I have discussed the findings of the evaluation and examination with Garnette.  I have also discussed any further diagnostic evaluation thatmay be needed or ordered today. Briggs verbalizes understanding of the findings of todays visit. We also reviewed his medications today and  discussed drug interactions and side effects including but not limited excessive drowsiness and altered mental states. We also discussed that there is always a risk not just to him but also people around him. he has been encouraged to call the office with any questions or concerns that should arise related to todays visit.  No orders of the defined types were placed in this encounter.    Time spent: 54  I have personally obtained a history, examined the patient, evaluated laboratory and imaging results, formulated the assessment and plan and placed orders.    Elfreda DELENA Bathe, MD [facility] Pulmonary and Critical Care Sleep medicine

## 2023-11-03 NOTE — Patient Instructions (Signed)

## 2023-11-12 ENCOUNTER — Telehealth: Payer: Self-pay

## 2023-11-12 ENCOUNTER — Ambulatory Visit

## 2023-11-12 DIAGNOSIS — E538 Deficiency of other specified B group vitamins: Secondary | ICD-10-CM

## 2023-11-12 MED ORDER — CYANOCOBALAMIN 1000 MCG/ML IJ SOLN
1000.0000 ug | Freq: Once | INTRAMUSCULAR | Status: AC
  Administered 2023-11-12: 1000 ug via INTRAMUSCULAR

## 2023-11-13 ENCOUNTER — Other Ambulatory Visit: Payer: Self-pay | Admitting: Physician Assistant

## 2023-11-13 NOTE — Telephone Encounter (Signed)
 Done

## 2024-01-11 ENCOUNTER — Telehealth: Payer: Self-pay

## 2024-01-11 NOTE — Telephone Encounter (Signed)
 Pt called for jury duty letter ready at front desk for pickup and copy for scan

## 2024-01-14 ENCOUNTER — Telehealth: Payer: Self-pay

## 2024-01-14 ENCOUNTER — Other Ambulatory Visit: Payer: Self-pay

## 2024-01-14 DIAGNOSIS — J449 Chronic obstructive pulmonary disease, unspecified: Secondary | ICD-10-CM

## 2024-01-14 MED ORDER — TRELEGY ELLIPTA 100-62.5-25 MCG/ACT IN AEPB: 1.0000 | INHALATION_SPRAY | Freq: Every day | RESPIRATORY_TRACT | 3 refills | Status: AC

## 2024-01-15 ENCOUNTER — Other Ambulatory Visit: Payer: Self-pay | Admitting: Physician Assistant

## 2024-01-15 DIAGNOSIS — F411 Generalized anxiety disorder: Secondary | ICD-10-CM

## 2024-01-15 MED ORDER — CLONAZEPAM 0.5 MG PO TABS: ORAL_TABLET | ORAL | 1 refills | Status: AC

## 2024-01-18 NOTE — Telephone Encounter (Signed)
 Med sent.

## 2024-01-25 ENCOUNTER — Other Ambulatory Visit: Payer: Self-pay | Admitting: Physician Assistant

## 2024-01-25 DIAGNOSIS — J449 Chronic obstructive pulmonary disease, unspecified: Secondary | ICD-10-CM

## 2024-01-26 ENCOUNTER — Telehealth: Payer: Self-pay | Admitting: Physician Assistant

## 2024-01-26 NOTE — Telephone Encounter (Signed)
 Attempted to confirm 02/01/24 appointment. No answer, mb full-Toni

## 2024-02-01 ENCOUNTER — Ambulatory Visit: Payer: PPO | Admitting: Physician Assistant

## 2024-02-08 ENCOUNTER — Ambulatory Visit: Admitting: Physician Assistant

## 2024-02-09 ENCOUNTER — Telehealth: Payer: Self-pay | Admitting: Physician Assistant

## 2024-02-09 NOTE — Telephone Encounter (Signed)
 Attempted to contact patient to reschedule 02/08/24 missed appointment due to weather. No answer, no vm-Toni

## 2024-03-03 ENCOUNTER — Ambulatory Visit: Admitting: Physician Assistant

## 2024-05-03 ENCOUNTER — Ambulatory Visit: Admitting: Internal Medicine

## 2024-11-02 ENCOUNTER — Encounter: Admitting: Internal Medicine
